# Patient Record
Sex: Female | Born: 1995 | Race: Black or African American | Hispanic: No | Marital: Single | Smoking: Former smoker
Health system: Southern US, Community
[De-identification: ages and names within clinical notes are randomized; demographics above are authoritative.]

---
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
## 2014-06-22 ENCOUNTER — Encounter (HOSPITAL_BASED_OUTPATIENT_CLINIC_OR_DEPARTMENT_OTHER): Payer: Self-pay

## 2014-06-22 ENCOUNTER — Emergency Department (HOSPITAL_BASED_OUTPATIENT_CLINIC_OR_DEPARTMENT_OTHER)
Admission: EM | Admit: 2014-06-22 | Discharge: 2014-06-22 | Disposition: A | Discharge status: Home / Self Care | Payer: Medicaid Other | Attending: Emergency Medicine | Admitting: Emergency Medicine

## 2014-06-22 DIAGNOSIS — O210 Mild hyperemesis gravidarum: Secondary | ICD-10-CM | POA: Diagnosis present

## 2014-06-22 DIAGNOSIS — O9989 Other specified diseases and conditions complicating pregnancy, childbirth and the puerperium: Secondary | ICD-10-CM | POA: Insufficient documentation

## 2014-06-22 DIAGNOSIS — Z3A00 Weeks of gestation of pregnancy not specified: Secondary | ICD-10-CM | POA: Diagnosis not present

## 2014-06-22 DIAGNOSIS — O99331 Smoking (tobacco) complicating pregnancy, first trimester: Secondary | ICD-10-CM | POA: Diagnosis not present

## 2014-06-22 DIAGNOSIS — R112 Nausea with vomiting, unspecified: Secondary | ICD-10-CM

## 2014-06-22 DIAGNOSIS — Z79899 Other long term (current) drug therapy: Secondary | ICD-10-CM | POA: Insufficient documentation

## 2014-06-22 DIAGNOSIS — R5383 Other fatigue: Secondary | ICD-10-CM | POA: Insufficient documentation

## 2014-06-22 DIAGNOSIS — Z3490 Encounter for supervision of normal pregnancy, unspecified, unspecified trimester: Secondary | ICD-10-CM

## 2014-06-22 DIAGNOSIS — O2341 Unspecified infection of urinary tract in pregnancy, first trimester: Secondary | ICD-10-CM | POA: Insufficient documentation

## 2014-06-22 DIAGNOSIS — F17219 Nicotine dependence, cigarettes, with unspecified nicotine-induced disorders: Secondary | ICD-10-CM | POA: Diagnosis not present

## 2014-06-22 LAB — URINALYSIS, ROUTINE W REFLEX MICROSCOPIC
Glucose, UA: NEGATIVE mg/dL
HGB URINE DIPSTICK: NEGATIVE
KETONES UR: 40 mg/dL — AB
Leukocytes, UA: NEGATIVE
Nitrite: NEGATIVE
PROTEIN: 100 mg/dL — AB
Specific Gravity, Urine: 1.023 (ref 1.005–1.030)
UROBILINOGEN UA: 0.2 mg/dL (ref 0.0–1.0)
pH: 5.5 (ref 5.0–8.0)

## 2014-06-22 LAB — URINE MICROSCOPIC-ADD ON

## 2014-06-22 LAB — PREGNANCY, URINE: Preg Test, Ur: POSITIVE — AB

## 2014-06-22 MED ORDER — SODIUM CHLORIDE 0.9 % IV BOLUS (SEPSIS)
1000.0000 mL | Freq: Once | INTRAVENOUS | Status: AC
  Administered 2014-06-22: 1000 mL via INTRAVENOUS

## 2014-06-22 MED ORDER — PROMETHAZINE HCL 25 MG PO TABS: 25.0000 mg | ORAL_TABLET | Freq: Four times a day (QID) | ORAL | Status: DC | PRN

## 2014-06-22 MED ORDER — NITROFURANTOIN MONOHYD MACRO 100 MG PO CAPS: 100.0000 mg | ORAL_CAPSULE | Freq: Two times a day (BID) | ORAL | Status: DC

## 2014-06-22 MED ORDER — ACETAMINOPHEN 500 MG PO TABS
1000.0000 mg | ORAL_TABLET | Freq: Once | ORAL | Status: AC
  Administered 2014-06-22: 1000 mg via ORAL
  Filled 2014-06-22: qty 2

## 2014-06-22 MED ORDER — PRENATAL COMPLETE 14-0.4 MG PO TABS: 14.0000 mg | ORAL_TABLET | Freq: Every day | ORAL | Status: DC

## 2014-06-22 MED ORDER — METOCLOPRAMIDE HCL 5 MG/ML IJ SOLN
10.0000 mg | Freq: Once | INTRAMUSCULAR | Status: AC
  Administered 2014-06-22: 10 mg via INTRAVENOUS
  Filled 2014-06-22: qty 2

## 2014-06-22 NOTE — Discharge Instructions (Signed)
Take prenatal vitamins daily. Take Phenergan as directed as needed for nausea and vomiting. This medication may cause drowsiness, do not drive or operate heavy machinery with Phenergan. Follow-up with the women's outpatient clinic to establish care with an OB/GYN. Take macrobid twice daily for 1 week.  First Trimester of Pregnancy The first trimester of pregnancy is from week 1 until the end of week 12 (months 1 through 3). A week after a sperm fertilizes an egg, the egg will implant on the wall of the uterus. This embryo will begin to develop into a baby. Genes from you and your partner are forming the baby. The female genes determine whether the baby is a boy or a girl. At 6-8 weeks, the eyes and face are formed, and the heartbeat can be seen on ultrasound. At the end of 12 weeks, all the baby's organs are formed.  Now that you are pregnant, you will want to do everything you can to have a healthy baby. Two of the most important things are to get good prenatal care and to follow your health care provider's instructions. Prenatal care is all the medical care you receive before the baby's birth. This care will help prevent, find, and treat any problems during the pregnancy and childbirth. BODY CHANGES Your body goes through many changes during pregnancy. The changes vary from woman to woman.   You may gain or lose a couple of pounds at first.  You may feel sick to your stomach (nauseous) and throw up (vomit). If the vomiting is uncontrollable, call your health care provider.  You may tire easily.  You may develop headaches that can be relieved by medicines approved by your health care provider.  You may urinate more often. Painful urination may mean you have a bladder infection.  You may develop heartburn as a result of your pregnancy.  You may develop constipation because certain hormones are causing the muscles that push waste through your intestines to slow down.  You may develop hemorrhoids  or swollen, bulging veins (varicose veins).  Your breasts may begin to grow larger and become tender. Your nipples may stick out more, and the tissue that surrounds them (areola) may become darker.  Your gums may bleed and may be sensitive to brushing and flossing.  Dark spots or blotches (chloasma, mask of pregnancy) may develop on your face. This will likely fade after the baby is born.  Your menstrual periods will stop.  You may have a loss of appetite.  You may develop cravings for certain kinds of food.  You may have changes in your emotions from day to day, such as being excited to be pregnant or being concerned that something may go wrong with the pregnancy and baby.  You may have more vivid and strange dreams.  You may have changes in your hair. These can include thickening of your hair, rapid growth, and changes in texture. Some women also have hair loss during or after pregnancy, or hair that feels dry or thin. Your hair will most likely return to normal after your baby is born. WHAT TO EXPECT AT YOUR PRENATAL VISITS During a routine prenatal visit:  You will be weighed to make sure you and the baby are growing normally.  Your blood pressure will be taken.  Your abdomen will be measured to track your baby's growth.  The fetal heartbeat will be listened to starting around week 10 or 12 of your pregnancy.  Test results from any previous visits will  be discussed. Your health care provider may ask you:  How you are feeling.  If you are feeling the baby move.  If you have had any abnormal symptoms, such as leaking fluid, bleeding, severe headaches, or abdominal cramping.  If you have any questions. Other tests that may be performed during your first trimester include:  Blood tests to find your blood type and to check for the presence of any previous infections. They will also be used to check for low iron levels (anemia) and Rh antibodies. Later in the pregnancy, blood  tests for diabetes will be done along with other tests if problems develop.  Urine tests to check for infections, diabetes, or protein in the urine.  An ultrasound to confirm the proper growth and development of the baby.  An amniocentesis to check for possible genetic problems.  Fetal screens for spina bifida and Down syndrome.  You may need other tests to make sure you and the baby are doing well. HOME CARE INSTRUCTIONS  Medicines  Follow your health care provider's instructions regarding medicine use. Specific medicines may be either safe or unsafe to take during pregnancy.  Take your prenatal vitamins as directed.  If you develop constipation, try taking a stool softener if your health care provider approves. Diet  Eat regular, well-balanced meals. Choose a variety of foods, such as meat or vegetable-based protein, fish, milk and low-fat dairy products, vegetables, fruits, and whole grain breads and cereals. Your health care provider will help you determine the amount of weight gain that is right for you.  Avoid raw meat and uncooked cheese. These carry germs that can cause birth defects in the baby.  Eating four or five small meals rather than three large meals a day may help relieve nausea and vomiting. If you start to feel nauseous, eating a few soda crackers can be helpful. Drinking liquids between meals instead of during meals also seems to help nausea and vomiting.  If you develop constipation, eat more high-fiber foods, such as fresh vegetables or fruit and whole grains. Drink enough fluids to keep your urine clear or pale yellow. Activity and Exercise  Exercise only as directed by your health care provider. Exercising will help you:  Control your weight.  Stay in shape.  Be prepared for labor and delivery.  Experiencing pain or cramping in the lower abdomen or low back is a good sign that you should stop exercising. Check with your health care provider before  continuing normal exercises.  Try to avoid standing for long periods of time. Move your legs often if you must stand in one place for a long time.  Avoid heavy lifting.  Wear low-heeled shoes, and practice good posture.  You may continue to have sex unless your health care provider directs you otherwise. Relief of Pain or Discomfort  Wear a good support bra for breast tenderness.   Take warm sitz baths to soothe any pain or discomfort caused by hemorrhoids. Use hemorrhoid cream if your health care provider approves.   Rest with your legs elevated if you have leg cramps or low back pain.  If you develop varicose veins in your legs, wear support hose. Elevate your feet for 15 minutes, 3-4 times a day. Limit salt in your diet. Prenatal Care  Schedule your prenatal visits by the twelfth week of pregnancy. They are usually scheduled monthly at first, then more often in the last 2 months before delivery.  Write down your questions. Take them to your  prenatal visits.  Keep all your prenatal visits as directed by your health care provider. Safety  Wear your seat belt at all times when driving.  Make a list of emergency phone numbers, including numbers for family, friends, the hospital, and police and fire departments. General Tips  Ask your health care provider for a referral to a local prenatal education class. Begin classes no later than at the beginning of month 6 of your pregnancy.  Ask for help if you have counseling or nutritional needs during pregnancy. Your health care provider can offer advice or refer you to specialists for help with various needs.  Do not use hot tubs, steam rooms, or saunas.  Do not douche or use tampons or scented sanitary pads.  Do not cross your legs for long periods of time.  Avoid cat litter boxes and soil used by cats. These carry germs that can cause birth defects in the baby and possibly loss of the fetus by miscarriage or stillbirth.  Avoid  all smoking, herbs, alcohol, and medicines not prescribed by your health care provider. Chemicals in these affect the formation and growth of the baby.  Schedule a dentist appointment. At home, brush your teeth with a soft toothbrush and be gentle when you floss. SEEK MEDICAL CARE IF:   You have dizziness.  You have mild pelvic cramps, pelvic pressure, or nagging pain in the abdominal area.  You have persistent nausea, vomiting, or diarrhea.  You have a bad smelling vaginal discharge.  You have pain with urination.  You notice increased swelling in your face, hands, legs, or ankles. SEEK IMMEDIATE MEDICAL CARE IF:   You have a fever.  You are leaking fluid from your vagina.  You have spotting or bleeding from your vagina.  You have severe abdominal cramping or pain.  You have rapid weight gain or loss.  You vomit blood or material that looks like coffee grounds.  You are exposed to [place] measles and have never had them.  You are exposed to fifth disease or chickenpox.  You develop a severe headache.  You have shortness of breath.  You have any kind of trauma, such as from a fall or a car accident. Document Released: 03/19/2001 Document Revised: 08/09/2013 Document Reviewed: 02/02/2013 [facility] Patient Information 2015 [place], [place]. This information is not intended to replace advice given to you by your health care provider. Make sure you discuss any questions you have with your health care provider. Hyperemesis Gravidarum Hyperemesis gravidarum is a severe form of nausea and vomiting that happens during pregnancy. Hyperemesis is worse than morning sickness. It may cause you to have nausea or vomiting all day for many days. It may keep you from eating and drinking enough food and liquids. Hyperemesis usually occurs during the first half (the first 20 weeks) of pregnancy. It often goes away once a woman is in her second half of pregnancy. However, sometimes hyperemesis  continues through an entire pregnancy.  CAUSES  The cause of this condition is not completely known but is thought to be related to changes in the body's hormones when pregnant. It could be from the high level of the pregnancy hormone or an increase in estrogen in the body.  SIGNS AND SYMPTOMS   Severe nausea and vomiting.  Nausea that does not go away.  Vomiting that does not allow you to keep any food down.  Weight loss and body fluid loss (dehydration).  Having no desire to eat or not liking food you  have previously enjoyed. DIAGNOSIS  Your health care provider will do a physical exam and ask you about your symptoms. He or she may also order blood tests and urine tests to make sure something else is not causing the problem.  TREATMENT  You may only need medicine to control the problem. If medicines do not control the nausea and vomiting, you will be treated in the hospital to prevent dehydration, increased acid in the blood (acidosis), weight loss, and changes in the electrolytes in your body that may harm the unborn baby (fetus). You may need IV fluids.  HOME CARE INSTRUCTIONS   Only take over-the-counter or prescription medicines as directed by your health care provider.  Try eating a couple of dry crackers or toast in the morning before getting out of bed.  Avoid foods and smells that upset your stomach.  Avoid fatty and spicy foods.  Eat 5-6 small meals a day.  Do not drink when eating meals. Drink between meals.  For snacks, eat high-protein foods, such as cheese.  Eat or suck on things that have ginger in them. Ginger helps nausea.  Avoid food preparation. The smell of food can spoil your appetite.  Avoid iron pills and iron in your multivitamins until after 3-4 months of being pregnant. However, consult with your health care provider before stopping any prescribed iron pills. SEEK MEDICAL CARE IF:   Your abdominal pain increases.  You have a severe  headache.  You have vision problems.  You are losing weight. SEEK IMMEDIATE MEDICAL CARE IF:   You are unable to keep fluids down.  You vomit blood.  You have constant nausea and vomiting.  You have excessive weakness.  You have extreme thirst.  You have dizziness or fainting.  You have a fever or persistent symptoms for more than 2-3 days.  You have a fever and your symptoms suddenly get worse. MAKE SURE YOU:   Understand these instructions.  Will watch your condition.  Will get help right away if you are not doing well or get worse. Document Released: 03/25/2005 Document Revised: 01/13/2013 Document Reviewed: 11/04/2012 [facility] Patient Information 2015 [place], [place]. This information is not intended to replace advice given to you by your health care provider. Make sure you discuss any questions you have with your health care provider.  Urinary Tract Infection Urinary tract infections (UTIs) can develop anywhere along your urinary tract. Your urinary tract is your body's drainage system for removing wastes and extra water. Your urinary tract includes two kidneys, two ureters, a bladder, and a urethra. Your kidneys are a pair of bean-shaped organs. Each kidney is about the size of your fist. They are located below your ribs, one on each side of your spine. CAUSES Infections are caused by microbes, which are microscopic organisms, including fungi, viruses, and bacteria. These organisms are so small that they can only be seen through a microscope. Bacteria are the microbes that most commonly cause UTIs. SYMPTOMS  Symptoms of UTIs may vary by age and gender of the patient and by the location of the infection. Symptoms in young women typically include a frequent and intense urge to urinate and a painful, burning feeling in the bladder or urethra during urination. Older women and men are more likely to be tired, shaky, and weak and have muscle aches and abdominal pain. A fever may  mean the infection is in your kidneys. Other symptoms of a kidney infection include pain in your back or sides below the ribs,  nausea, and vomiting. DIAGNOSIS To diagnose a UTI, your caregiver will ask you about your symptoms. Your caregiver also will ask to provide a urine sample. The urine sample will be tested for bacteria and white blood cells. White blood cells are made by your body to help fight infection. TREATMENT  Typically, UTIs can be treated with medication. Because most UTIs are caused by a bacterial infection, they usually can be treated with the use of antibiotics. The choice of antibiotic and length of treatment depend on your symptoms and the type of bacteria causing your infection. HOME CARE INSTRUCTIONS  If you were prescribed antibiotics, take them exactly as your caregiver instructs you. Finish the medication even if you feel better after you have only taken some of the medication.  Drink enough water and fluids to keep your urine clear or pale yellow.  Avoid caffeine, tea, and carbonated beverages. They tend to irritate your bladder.  Empty your bladder often. Avoid holding urine for long periods of time.  Empty your bladder before and after sexual intercourse.  After a bowel movement, women should cleanse from front to back. Use each tissue only once. SEEK MEDICAL CARE IF:   You have back pain.  You develop a fever.  Your symptoms do not begin to resolve within 3 days. SEEK IMMEDIATE MEDICAL CARE IF:   You have severe back pain or lower abdominal pain.  You develop chills.  You have nausea or vomiting.  You have continued burning or discomfort with urination. MAKE SURE YOU:   Understand these instructions.  Will watch your condition.  Will get help right away if you are not doing well or get worse. Document Released: 01/02/2005 Document Revised: 09/24/2011 Document Reviewed: 05/03/2011 [facility] Patient Information 2015 [place], [place]. This information  is not intended to replace advice given to you by your health care provider. Make sure you discuss any questions you have with your health care provider.

## 2014-06-22 NOTE — ED Provider Notes (Signed)
CSN: [id number]     Arrival date & time 06/22/14  1747 History   First MD Initiated Contact with Patient 06/22/14 1822     Chief Complaint  Patient presents with  . Emesis     (Consider location/radiation/quality/duration/timing/severity/associated sxs/prior Treatment) HPI Comments: 19 year old female complaining of nausea and vomiting 2 days. Reports yesterday evening she started to feel nauseous, and since then has had 20-25 episodes of nonbloody, nonbilious emesis. Yesterday her appetite was decreased. Unable to keep anything down today. Denies abdominal pain, fever, chills, urinary symptoms, vaginal bleeding or discharge. Last menstrual period was in the middle of last month, she is due for her cycle at this time. Not on birth control. She is starting to feel tired.  Patient is a 19 y.o. female presenting with vomiting. The history is provided by the patient.  Emesis Severity:  Severe Duration:  2 days Timing:  Constant Number of daily episodes:  20 Progression:  Unchanged Chronicity:  New Recent urination:  Normal Relieved by:  Nothing Worsened by:  Nothing tried Ineffective treatments:  None tried   History reviewed. No pertinent past medical history. History reviewed. No pertinent past surgical history. History reviewed. No pertinent family history. History  Substance Use Topics  . Smoking status: Current Some Day Smoker    Types: Cigarettes  . Smokeless tobacco: Not on file  . Alcohol Use: Yes     Comment: occ   OB History    No data available     Review of Systems  Constitutional: Positive for fatigue.  Gastrointestinal: Positive for nausea and vomiting.  All other systems reviewed and are negative.     Allergies  Review of patient's allergies indicates no known allergies.  Home Medications   Prior to Admission medications   Medication Sig Start Date End Date Taking? Authorizing Provider  amoxicillin-clavulanate (AUGMENTIN) 875-125 MG per tablet Take  1 tablet by mouth every 12 (twelve) hours. 11/25/13   Teressa LowerVrinda Pickering, NP  HYDROcodone-acetaminophen (NORCO/VICODIN) 5-325 MG per tablet Take 1 tablet by mouth every 4 (four) hours as needed. 04/26/13   Garlon HatchetLisa M Sanders, PA-C  HYDROcodone-acetaminophen (NORCO/VICODIN) 5-325 MG per tablet Take 1-2 tablets by mouth every 4 (four) hours as needed. 11/25/13   Teressa LowerVrinda Pickering, NP  Prenatal Vit-Fe Fumarate-FA (PRENATAL COMPLETE) 14-0.4 MG TABS Take 14 mg by mouth daily. 06/22/14   Kathrynn Speedobyn M Mikesha Migliaccio, PA-C  promethazine (PHENERGAN) 25 MG tablet Take 1 tablet (25 mg total) by mouth every 6 (six) hours as needed for nausea or vomiting. 06/22/14   Arvid Marengo M Mohamadou Maciver, PA-C   BP 122/76 mmHg  Pulse 84  Temp(Src) 98.6 F (37 C) (Oral)  Resp 16  Ht 5\' 6"  (1.676 m)  Wt 128 lb (58.06 kg)  BMI 20.67 kg/m2  SpO2 97%  LMP 05/23/2014 Physical Exam  Constitutional: She is oriented to person, place, and time. She appears well-developed and well-nourished. No distress.  HENT:  Head: Normocephalic and atraumatic.  Mouth/Throat: Oropharynx is clear and moist.  Eyes: Conjunctivae are normal.  Neck: Normal range of motion. Neck supple.  Cardiovascular: Normal rate, regular rhythm and normal heart sounds.   Pulmonary/Chest: Effort normal and breath sounds normal.  Abdominal: Soft. Bowel sounds are normal. She exhibits no distension and no mass. There is no tenderness. There is no rebound and no guarding.  Musculoskeletal: Normal range of motion. She exhibits no edema.  Neurological: She is alert and oriented to person, place, and time.  Skin: Skin is warm and dry. She is  not diaphoretic.  Psychiatric: She has a normal mood and affect. Her behavior is normal.  Nursing note and vitals reviewed.   ED Course  Procedures (including critical care time) Labs Review Labs Reviewed  PREGNANCY, URINE - Abnormal; Notable for the following:    Preg Test, Ur POSITIVE (*)    All other components within normal limits  URINALYSIS, ROUTINE  W REFLEX MICROSCOPIC - Abnormal; Notable for the following:    Color, Urine AMBER (*)    APPearance CLOUDY (*)    Bilirubin Urine SMALL (*)    Ketones, ur 40 (*)    Protein, ur 100 (*)    All other components within normal limits  URINE MICROSCOPIC-ADD ON - Abnormal; Notable for the following:    Squamous Epithelial / LPF MANY (*)    Bacteria, UA FEW (*)    Casts HYALINE CASTS (*)    All other components within normal limits  URINE CULTURE    Imaging Review No results found.   EKG Interpretation None      MDM   Final diagnoses:  Non-intractable vomiting with nausea, vomiting of unspecified type  Pregnancy  Hyperemesis gravidarum   NAD. AFVSS. Abdomen soft and nontender. Moist mucous membranes. Urine pregnancy positive. Urinalysis positive for infection. After receiving IV fluids, Phenergan and Tylenol, patient reports she is feeling much better. Tolerating PO. Nausea and vomiting most likely from early pregnancy. She has never been pregnant in the past. No vaginal bleeding. Stable for discharge, will discharge home with Macrobid, prenatal vitamins and Phenergan. I advised her to follow-up with women's outpatient clinic as soon as possible. Stable for discharge. Return precautions given. Patient states understanding of treatment care plan and is agreeable.  Kathrynn Speed, PA-C 06/22/14 2040  Elwin Mocha, MD 06/22/14 [phone]

## 2014-06-22 NOTE — ED Notes (Addendum)
Pt reports 2 day history of N/V/D - reports slight headache, no appetite.

## 2014-06-23 LAB — URINE CULTURE
Colony Count: NO GROWTH
Culture: NO GROWTH

## 2014-09-19 ENCOUNTER — Emergency Department (HOSPITAL_BASED_OUTPATIENT_CLINIC_OR_DEPARTMENT_OTHER)
Admission: EM | Admit: 2014-09-19 | Discharge: 2014-09-19 | Disposition: A | Discharge status: Home / Self Care | Payer: Medicaid Other | Attending: Emergency Medicine | Admitting: Emergency Medicine

## 2014-09-19 ENCOUNTER — Emergency Department (HOSPITAL_BASED_OUTPATIENT_CLINIC_OR_DEPARTMENT_OTHER): Payer: Medicaid Other

## 2014-09-19 ENCOUNTER — Encounter (HOSPITAL_BASED_OUTPATIENT_CLINIC_OR_DEPARTMENT_OTHER): Payer: Self-pay | Admitting: Emergency Medicine

## 2014-09-19 DIAGNOSIS — F1310 Sedative, hypnotic or anxiolytic abuse, uncomplicated: Secondary | ICD-10-CM | POA: Insufficient documentation

## 2014-09-19 DIAGNOSIS — F1510 Other stimulant abuse, uncomplicated: Secondary | ICD-10-CM | POA: Diagnosis not present

## 2014-09-19 DIAGNOSIS — Z3202 Encounter for pregnancy test, result negative: Secondary | ICD-10-CM | POA: Diagnosis not present

## 2014-09-19 DIAGNOSIS — F410 Panic disorder [episodic paroxysmal anxiety] without agoraphobia: Secondary | ICD-10-CM | POA: Diagnosis not present

## 2014-09-19 DIAGNOSIS — F1290 Cannabis use, unspecified, uncomplicated: Secondary | ICD-10-CM

## 2014-09-19 DIAGNOSIS — Z720 Tobacco use: Secondary | ICD-10-CM | POA: Diagnosis not present

## 2014-09-19 DIAGNOSIS — F1210 Cannabis abuse, uncomplicated: Secondary | ICD-10-CM | POA: Insufficient documentation

## 2014-09-19 DIAGNOSIS — Z79899 Other long term (current) drug therapy: Secondary | ICD-10-CM | POA: Diagnosis not present

## 2014-09-19 DIAGNOSIS — R079 Chest pain, unspecified: Secondary | ICD-10-CM | POA: Diagnosis present

## 2014-09-19 LAB — CBC
HCT: 40.8 % (ref 36.0–46.0)
Hemoglobin: 13.2 g/dL (ref 12.0–15.0)
MCH: 29.9 pg (ref 26.0–34.0)
MCHC: 32.4 g/dL (ref 30.0–36.0)
MCV: 92.3 fL (ref 78.0–100.0)
Platelets: 256 10*3/uL (ref 150–400)
RBC: 4.42 MIL/uL (ref 3.87–5.11)
RDW: 14.8 % (ref 11.5–15.5)
WBC: 14.2 10*3/uL — ABNORMAL HIGH (ref 4.0–10.5)

## 2014-09-19 LAB — BASIC METABOLIC PANEL
Anion gap: 10 (ref 5–15)
BUN: 11 mg/dL (ref 6–20)
CO2: 24 mmol/L (ref 22–32)
Calcium: 8.8 mg/dL — ABNORMAL LOW (ref 8.9–10.3)
Chloride: 104 mmol/L (ref 101–111)
Creatinine, Ser: 0.72 mg/dL (ref 0.44–1.00)
GFR calc Af Amer: >60 mL/min (ref >60)
GFR calc non Af Amer: >60 mL/min (ref >60)
GLUCOSE: 151 mg/dL — AB (ref 65–99)
Potassium: 3.5 mmol/L (ref 3.5–5.1)
Sodium: 138 mmol/L (ref 135–145)

## 2014-09-19 LAB — RAPID URINE DRUG SCREEN, HOSP PERFORMED
AMPHETAMINES: POSITIVE — AB
BENZODIAZEPINES: POSITIVE — AB
Barbiturates: NOT DETECTED
COCAINE: NOT DETECTED
OPIATES: NOT DETECTED
Tetrahydrocannabinol: POSITIVE — AB

## 2014-09-19 LAB — PREGNANCY, URINE: Preg Test, Ur: NEGATIVE

## 2014-09-19 MED ORDER — ONDANSETRON HCL 4 MG/2ML IJ SOLN
4.0000 mg | Freq: Once | INTRAMUSCULAR | Status: AC
  Administered 2014-09-19: 4 mg via INTRAVENOUS
  Filled 2014-09-19: qty 2

## 2014-09-19 MED ORDER — ONDANSETRON 4 MG PO TBDP
4.0000 mg | ORAL_TABLET | Freq: Once | ORAL | Status: AC
  Administered 2014-09-19: 4 mg via ORAL
  Filled 2014-09-19: qty 1

## 2014-09-19 MED ORDER — LORAZEPAM 2 MG/ML IJ SOLN
1.0000 mg | Freq: Once | INTRAMUSCULAR | Status: AC
  Administered 2014-09-19: 1 mg via INTRAMUSCULAR
  Filled 2014-09-19: qty 1

## 2014-09-19 NOTE — ED Notes (Signed)
Patient transported to X-ray and returned 

## 2014-09-19 NOTE — ED Provider Notes (Signed)
CSN: [id number]     Arrival date & time 09/19/14  1744 History   First MD Initiated Contact with Patient 09/19/14 1811     Chief Complaint  Patient presents with  . Emesis  . Chest Pain     (Consider location/radiation/quality/duration/timing/severity/associated sxs/prior Treatment) HPI Comments: 19 year old female complaining of gradual onset chest pain beginning yesterday evening when she tried a "small amount of Molly". States she has never tried this before. Pain is located in the center of her chest, described as sharp and tight. No aggravating or alleviating factors. Denies shortness of breath. This morning, she felt nauseous and vomited 3 times, nonbloody emesis. States she has a sensation that her chest is "slowing down". Denies fever, chills, abdominal pain, headache, dizziness, lightheadedness, numbness, tingling or weakness. Denies any other drug use. No alcohol use. Nonsmoker. No family history of heart disease. Patient is a poor historian and getting agitated when asking questions.  Patient is a 19 y.o. female presenting with vomiting and chest pain. The history is provided by the patient and a parent.  Emesis Chest Pain Associated symptoms: nausea and vomiting     History reviewed. No pertinent past medical history. History reviewed. No pertinent past surgical history. History reviewed. No pertinent family history. History  Substance Use Topics  . Smoking status: Current Some Day Smoker    Types: Cigarettes  . Smokeless tobacco: Not on file  . Alcohol Use: Yes     Comment: occ   OB History    No data available     Review of Systems  Cardiovascular: Positive for chest pain.  Gastrointestinal: Positive for nausea and vomiting.  All other systems reviewed and are negative.     Allergies  Review of patient's allergies indicates no known allergies.  Home Medications   Prior to Admission medications   Medication Sig Start Date End Date Taking? Authorizing  Provider  amoxicillin-clavulanate (AUGMENTIN) 875-125 MG per tablet Take 1 tablet by mouth every 12 (twelve) hours. 11/25/13   Teressa Lower, NP  HYDROcodone-acetaminophen (NORCO/VICODIN) 5-325 MG per tablet Take 1 tablet by mouth every 4 (four) hours as needed. 04/26/13   Garlon Hatchet, PA-C  HYDROcodone-acetaminophen (NORCO/VICODIN) 5-325 MG per tablet Take 1-2 tablets by mouth every 4 (four) hours as needed. 11/25/13   Teressa Lower, NP  nitrofurantoin, macrocrystal-monohydrate, (MACROBID) 100 MG capsule Take 1 capsule (100 mg total) by mouth 2 (two) times daily. X 7 days 06/22/14   Kathrynn Speed, PA-C  Prenatal Vit-Fe Fumarate-FA (PRENATAL COMPLETE) 14-0.4 MG TABS Take 14 mg by mouth daily. 06/22/14   Kathrynn Speed, PA-C  promethazine (PHENERGAN) 25 MG tablet Take 1 tablet (25 mg total) by mouth every 6 (six) hours as needed for nausea or vomiting. 06/22/14   Rafaela Dinius M Anett Ranker, PA-C   BP 116/98 mmHg  Pulse 90  Temp(Src) 97.8 F (36.6 C) (Oral)  Resp 20  Ht 5\' 6"  (1.676 m)  Wt 122 lb (55.339 kg)  BMI 19.70 kg/m2  SpO2 94% Physical Exam  Constitutional: She is oriented to person, place, and time. She appears well-developed and well-nourished. No distress.  HENT:  Head: Normocephalic and atraumatic.  Mouth/Throat: Oropharynx is clear and moist.  Eyes: Conjunctivae and EOM are normal. Pupils are equal, round, and reactive to light.  Neck: Normal range of motion. Neck supple. No JVD present.  Cardiovascular: Normal rate, regular rhythm, normal heart sounds and intact distal pulses.   No extremity edema.  Pulmonary/Chest: Effort normal and breath sounds normal.  No respiratory distress.  Abdominal: Soft. Bowel sounds are normal. There is no tenderness.  Musculoskeletal: Normal range of motion. She exhibits no edema.  Neurological: She is alert and oriented to person, place, and time. She has normal strength. No sensory deficit.  Speech fluent, goal oriented. Moves limbs without ataxia. Equal  grip strength bilateral.  Skin: Skin is warm and dry. She is not diaphoretic.  Psychiatric: Her mood appears anxious. She is agitated.  Nursing note and vitals reviewed.   ED Course  Procedures (including critical care time) Labs Review Labs Reviewed  URINE RAPID DRUG SCREEN, HOSP PERFORMED - Abnormal; Notable for the following:    Benzodiazepines POSITIVE (*)    Amphetamines POSITIVE (*)    Tetrahydrocannabinol POSITIVE (*)    All other components within normal limits  CBC - Abnormal; Notable for the following:    WBC 14.2 (*)    All other components within normal limits  BASIC METABOLIC PANEL - Abnormal; Notable for the following:    Glucose, Bld 151 (*)    Calcium 8.8 (*)    All other components within normal limits  PREGNANCY, URINE    Imaging Review Dg Chest 2 View  09/19/2014   CLINICAL DATA:  Chest pain, difficulty breathing, vomiting  EXAM: CHEST  2 VIEW  COMPARISON:  None.  FINDINGS: Lungs are clear.  No pleural effusion or pneumothorax.  Nipple shadows overlying the bilateral lower lungs.  Heart is normal in size.  Visualized osseous structures are within normal limits.  IMPRESSION: Normal chest radiographs.   Electronically Signed   By: Charline Bills M.D.   On: 09/19/2014 19:06     EKG Interpretation   Date/Time:  Monday September 19 2014 18:22:42 EDT Ventricular Rate:  113 PR Interval:  154 QRS Duration: 78 QT Interval:  390 QTC Calculation: 534 R Axis:   76 Text Interpretation:  Sinus tachycardia with Premature supraventricular  complexes Nonspecific ST abnormality Prolonged QT Abnormal ECG Confirmed  by DELO  MD, DOUGLAS ([id number]) on 09/19/2014 7:10:31 PM      MDM   Final diagnoses:  Panic attack  Amphetamine abuse  Marijuana use   Nontoxic appearing, NAD. AF VSS. Symptoms beginning after taking a "Molly". Appears to be having a panic attack. Urine drug screen positive for benzos, amphetamines and THC. Has a leukocytosis of 14.2, I do not feel this is  from any infectious process. Doubt cardiac or PE. Low risk. Symptoms improved after Ativan. Advised against drug use. Stable for discharge. Return precautions given. Patient states understanding of treatment care plan and is agreeable.  Kathrynn Speed, PA-C 09/19/14 2029  Kathrynn Speed, PA-C 09/19/14 2030  Geoffery Lyons, MD 09/20/14 [phone]

## 2014-09-19 NOTE — ED Notes (Signed)
D/c home with ride- no new rx given 

## 2014-09-19 NOTE — ED Notes (Signed)
Patient transported to X-ray 

## 2014-09-19 NOTE — ED Notes (Signed)
Patient tried some "molly" last night and today she has had abdominal pain and vomiting. The patient reports that her "chest is slowing down"

## 2014-09-19 NOTE — Discharge Instructions (Signed)
Panic Attacks °Panic attacks are sudden, short-lived surges of severe anxiety, fear, or discomfort. They may occur for no reason when you are relaxed, when you are anxious, or when you are sleeping. Panic attacks may occur for a number of reasons:  °· Healthy people occasionally have panic attacks in extreme, life-threatening situations, such as war or natural disasters. Normal anxiety is a protective mechanism of the body that helps us react to danger (fight or flight response). °· Panic attacks are often seen with anxiety disorders, such as panic disorder, social anxiety disorder, generalized anxiety disorder, and phobias. Anxiety disorders cause excessive or uncontrollable anxiety. They may interfere with your relationships or other life activities. °· Panic attacks are sometimes seen with other mental illnesses, such as depression and posttraumatic stress disorder. °· Certain medical conditions, prescription medicines, and drugs of abuse can cause panic attacks. °SYMPTOMS  °Panic attacks start suddenly, peak within 20 minutes, and are accompanied by four or more of the following symptoms: °· Pounding heart or fast heart rate (palpitations). °· Sweating. °· Trembling or shaking. °· Shortness of breath or feeling smothered. °· Feeling choked. °· Chest pain or discomfort. °· Nausea or strange feeling in your stomach. °· Dizziness, light-headedness, or feeling like you will faint. °· Chills or hot flushes. °· Numbness or tingling in your lips or hands and feet. °· Feeling that things are not real or feeling that you are not yourself. °· Fear of losing control or going crazy. °· Fear of dying. °Some of these symptoms can mimic serious medical conditions. For example, you may think you are having a heart attack. Although panic attacks can be very scary, they are not life threatening. °DIAGNOSIS  °Panic attacks are diagnosed through an assessment by your health care provider. Your health care provider will ask  questions about your symptoms, such as where and when they occurred. Your health care provider will also ask about your medical history and use of alcohol and drugs, including prescription medicines. Your health care provider may order blood tests or other studies to rule out a serious medical condition. Your health care provider may refer you to a mental health professional for further evaluation. °TREATMENT  °· Most healthy people who have one or two panic attacks in an extreme, life-threatening situation will not require treatment. °· The treatment for panic attacks associated with anxiety disorders or other mental illness typically involves counseling with a mental health professional, medicine, or a combination of both. Your health care provider will help determine what treatment is best for you. °· Panic attacks due to physical illness usually go away with treatment of the illness. If prescription medicine is causing panic attacks, talk with your health care provider about stopping the medicine, decreasing the dose, or substituting another medicine. °· Panic attacks due to alcohol or drug abuse go away with abstinence. Some adults need professional help in order to stop drinking or using drugs. °HOME CARE INSTRUCTIONS  °· Take all medicines as directed by your health care provider.   °· Schedule and attend follow-up visits as directed by your health care provider. It is important to keep all your appointments. °SEEK MEDICAL CARE IF: °· You are not able to take your medicines as prescribed. °· Your symptoms do not improve or get worse. °SEEK IMMEDIATE MEDICAL CARE IF:  °· You experience panic attack symptoms that are different than your usual symptoms. °· You have serious thoughts about hurting yourself or others. °· You are taking medicine for panic attacks and   have a serious side effect. MAKE SURE YOU:  Understand these instructions.  Will watch your condition.  Will get help right away if you are not  doing well or get worse. Document Released: 03/25/2005 Document Revised: 03/30/2013 Document Reviewed: 11/06/2012 [facility] Patient Information 2015 [place], [place]. This information is not intended to replace advice given to you by your health care provider. Make sure you discuss any questions you have with your health care provider.   Chemical Dependency Chemical dependency is an addiction to drugs or alcohol. It is characterized by the repeated behavior of seeking out and using drugs and alcohol despite harmful consequences to the health and safety of ones self and others.  RISK FACTORS There are certain situations or behaviors that increase a person's risk for chemical dependency. These include:  A family history of chemical dependency.  A history of mental health issues, including depression and anxiety.  A home environment where drugs and alcohol are easily available to you.  Drug or alcohol use at a young age. SYMPTOMS  The following symptoms can indicate chemical dependency:  Inability to limit the use of drugs or alcohol.  Nausea, sweating, shakiness, and anxiety that occurs when alcohol or drugs are not being used.  An increase in amount of drugs or alcohol that is necessary to get drunk or high. People who experience these symptoms can assess their use of drugs and alcohol by asking themselves the following questions:  Have you been told by friends or family that they are worried about your use of alcohol or drugs?  Do friends and family ever tell you about things you did while drinking alcohol or using drugs that you do not remember?  Do you lie about using alcohol or drugs or about the amounts you use?  Do you have difficulty completing daily tasks unless you use alcohol or drugs?  Is the level of your work or school performance lower because of your drug or alcohol use?  Do you get sick from using drugs or alcohol but keep using anyway?  Do you feel uncomfortable  in social situations unless you use alcohol or drugs?  Do you use drugs or alcohol to help forget problems? An answer of yes to any of these questions may indicate chemical dependency. Professional evaluation is suggested. Document Released: 03/19/2001 Document Revised: 06/17/2011 Document Reviewed: 05/31/2010 [facility] Patient Information 2015 [place], [place]. This information is not intended to replace advice given to you by your health care provider. Make sure you discuss any questions you have with your health care provider.

## 2014-10-15 ENCOUNTER — Encounter (HOSPITAL_BASED_OUTPATIENT_CLINIC_OR_DEPARTMENT_OTHER): Payer: Self-pay | Admitting: Emergency Medicine

## 2014-10-15 ENCOUNTER — Emergency Department (HOSPITAL_BASED_OUTPATIENT_CLINIC_OR_DEPARTMENT_OTHER): Payer: Medicaid Other

## 2014-10-15 ENCOUNTER — Emergency Department (HOSPITAL_BASED_OUTPATIENT_CLINIC_OR_DEPARTMENT_OTHER)
Admission: EM | Admit: 2014-10-15 | Discharge: 2014-10-15 | Disposition: A | Discharge status: Home / Self Care | Payer: Medicaid Other | Attending: Emergency Medicine | Admitting: Emergency Medicine

## 2014-10-15 DIAGNOSIS — R109 Unspecified abdominal pain: Secondary | ICD-10-CM | POA: Insufficient documentation

## 2014-10-15 DIAGNOSIS — Z720 Tobacco use: Secondary | ICD-10-CM | POA: Diagnosis not present

## 2014-10-15 DIAGNOSIS — Z79899 Other long term (current) drug therapy: Secondary | ICD-10-CM | POA: Insufficient documentation

## 2014-10-15 DIAGNOSIS — R197 Diarrhea, unspecified: Secondary | ICD-10-CM | POA: Insufficient documentation

## 2014-10-15 DIAGNOSIS — Z3202 Encounter for pregnancy test, result negative: Secondary | ICD-10-CM | POA: Diagnosis not present

## 2014-10-15 DIAGNOSIS — R112 Nausea with vomiting, unspecified: Secondary | ICD-10-CM | POA: Diagnosis present

## 2014-10-15 LAB — COMPREHENSIVE METABOLIC PANEL
ALT: 17 U/L (ref 14–54)
AST: 24 U/L (ref 15–41)
Albumin: 4.4 g/dL (ref 3.5–5.0)
Alkaline Phosphatase: 41 U/L (ref 38–126)
Anion gap: 11 (ref 5–15)
BUN: 12 mg/dL (ref 6–20)
CO2: 21 mmol/L — ABNORMAL LOW (ref 22–32)
Calcium: 9.2 mg/dL (ref 8.9–10.3)
Chloride: 105 mmol/L (ref 101–111)
Creatinine, Ser: 0.6 mg/dL (ref 0.44–1.00)
GFR calc Af Amer: >60 mL/min (ref >60)
GFR calc non Af Amer: >60 mL/min (ref >60)
Glucose, Bld: 108 mg/dL — ABNORMAL HIGH (ref 65–99)
Potassium: 3.7 mmol/L (ref 3.5–5.1)
Sodium: 137 mmol/L (ref 135–145)
Total Bilirubin: 0.7 mg/dL (ref 0.3–1.2)
Total Protein: 7.8 g/dL (ref 6.5–8.1)

## 2014-10-15 LAB — CBC WITH DIFFERENTIAL/PLATELET
Basophils Absolute: 0 10*3/uL (ref 0.0–0.1)
Basophils Relative: 0 % (ref 0–1)
Eosinophils Absolute: 0 10*3/uL (ref 0.0–0.7)
Eosinophils Relative: 0 % (ref 0–5)
HCT: 36.9 % (ref 36.0–46.0)
Hemoglobin: 12 g/dL (ref 12.0–15.0)
Lymphocytes Relative: 6 % — ABNORMAL LOW (ref 12–46)
Lymphs Abs: 0.4 10*3/uL — ABNORMAL LOW (ref 0.7–4.0)
MCH: 29.7 pg (ref 26.0–34.0)
MCHC: 32.5 g/dL (ref 30.0–36.0)
MCV: 91.3 fL (ref 78.0–100.0)
Monocytes Absolute: 0.2 10*3/uL (ref 0.1–1.0)
Monocytes Relative: 2 % — ABNORMAL LOW (ref 3–12)
Neutro Abs: 6.6 10*3/uL (ref 1.7–7.7)
Neutrophils Relative %: 92 % — ABNORMAL HIGH (ref 43–77)
Platelets: 211 10*3/uL (ref 150–400)
RBC: 4.04 MIL/uL (ref 3.87–5.11)
RDW: 13.9 % (ref 11.5–15.5)
WBC: 7.2 10*3/uL (ref 4.0–10.5)

## 2014-10-15 LAB — URINALYSIS, ROUTINE W REFLEX MICROSCOPIC
Bilirubin Urine: NEGATIVE
GLUCOSE, UA: NEGATIVE mg/dL
Hgb urine dipstick: NEGATIVE
Ketones, ur: >80 mg/dL — AB
Leukocytes, UA: NEGATIVE
Nitrite: NEGATIVE
PH: 8.5 — AB (ref 5.0–8.0)
PROTEIN: 30 mg/dL — AB
Specific Gravity, Urine: 1.027 (ref 1.005–1.030)
Urobilinogen, UA: 1 mg/dL (ref 0.0–1.0)

## 2014-10-15 LAB — URINE MICROSCOPIC-ADD ON

## 2014-10-15 LAB — LIPASE, BLOOD: Lipase: 16 U/L — ABNORMAL LOW (ref 22–51)

## 2014-10-15 LAB — PREGNANCY, URINE: PREG TEST UR: NEGATIVE

## 2014-10-15 MED ORDER — ONDANSETRON HCL 4 MG/2ML IJ SOLN: 4.0000 mg | Freq: Once | INTRAMUSCULAR | Status: DC

## 2014-10-15 MED ORDER — PROMETHAZINE HCL 25 MG/ML IJ SOLN
12.5000 mg | Freq: Once | INTRAMUSCULAR | Status: AC
  Administered 2014-10-15: 12.5 mg via INTRAVENOUS
  Filled 2014-10-15: qty 1

## 2014-10-15 MED ORDER — SODIUM CHLORIDE 0.9 % IV BOLUS (SEPSIS)
1000.0000 mL | Freq: Once | INTRAVENOUS | Status: AC
  Administered 2014-10-15: 1000 mL via INTRAVENOUS

## 2014-10-15 MED ORDER — PROMETHAZINE HCL 25 MG PO TABS: 25.0000 mg | ORAL_TABLET | Freq: Four times a day (QID) | ORAL | Status: DC | PRN

## 2014-10-15 MED ORDER — ONDANSETRON 8 MG PO TBDP
8.0000 mg | ORAL_TABLET | Freq: Once | ORAL | Status: AC
  Administered 2014-10-15: 8 mg via ORAL
  Filled 2014-10-15: qty 1

## 2014-10-15 NOTE — ED Notes (Signed)
Pt approached Nurse First to inquire about the wait time. Pt was updated appropriately and instructed to notify staff about changes in her condition or needs. Pt has been noted to be resting quietly, free of any retching or emesis since administration of Zofran.

## 2014-10-15 NOTE — ED Provider Notes (Signed)
CSN: [id number]   Arrival date & time 10/15/14 1431  History  This chart was scribed for  Geoffery Lyonsouglas Ranjit Ashurst, MD by Bethel BornBritney McCollum, ED Scribe. This patient was seen in room MH09/MH09 and the patient's care was started at 4:30 PM.  Chief Complaint  Patient presents with  . Emesis    HPI Patient is a 19 y.o. female presenting with vomiting. The history is provided by the patient. No language interpreter was used.  Emesis Severity:  Mild Timing:  Intermittent Quality:  Stomach contents Progression:  Worsening Chronicity:  Recurrent Recent urination:  Normal Context: not post-tussive and not self-induced   Relieved by:  Nothing Worsened by:  Nothing tried Associated symptoms: abdominal pain and diarrhea   Associated symptoms: no chills, no cough, no fever, no headaches, no myalgias, no sore throat and no URI    Berneda RoseKimberly Townley is a 19 y.o. female who presents to the Emergency Department complaining of nausea and  vomiting with sudden onset around 8 AM today after eating grits. Associated symptoms include abdominal pain just before vomiting and 2 episodes of diarrhea. Pt notes that she has been seen in the ED several times for similar symptoms and has 2-3 of these episodes per month. She denies fever and dysuria. No known sick contact. Menstruating normally, LNMP was "last month". She smokes marijuana daily and notes having no appetite without the drug.   History reviewed. No pertinent past medical history.  History reviewed. No pertinent past surgical history.  History reviewed. No pertinent family history.  History  Substance Use Topics  . Smoking status: Current Some Day Smoker    Types: Cigarettes  . Smokeless tobacco: Not on file  . Alcohol Use: Yes     Comment: occ     Review of Systems  Constitutional: Negative for fever and chills.  HENT: Negative for sore throat.   Respiratory: Negative for cough.   Cardiovascular: Negative for chest pain.  Gastrointestinal: Positive for  vomiting, abdominal pain and diarrhea.  Musculoskeletal: Negative for myalgias.  Neurological: Negative for headaches.  All other systems reviewed and are negative.   Home Medications   Prior to Admission medications   Medication Sig Start Date End Date Taking? Authorizing Provider  amoxicillin-clavulanate (AUGMENTIN) 875-125 MG per tablet Take 1 tablet by mouth every 12 (twelve) hours. 11/25/13   Teressa LowerVrinda Pickering, NP  HYDROcodone-acetaminophen (NORCO/VICODIN) 5-325 MG per tablet Take 1 tablet by mouth every 4 (four) hours as needed. 04/26/13   Garlon HatchetLisa M Sanders, PA-C  HYDROcodone-acetaminophen (NORCO/VICODIN) 5-325 MG per tablet Take 1-2 tablets by mouth every 4 (four) hours as needed. 11/25/13   Teressa LowerVrinda Pickering, NP  nitrofurantoin, macrocrystal-monohydrate, (MACROBID) 100 MG capsule Take 1 capsule (100 mg total) by mouth 2 (two) times daily. X 7 days 06/22/14   Kathrynn Speedobyn M Hess, PA-C  Prenatal Vit-Fe Fumarate-FA (PRENATAL COMPLETE) 14-0.4 MG TABS Take 14 mg by mouth daily. 06/22/14   Kathrynn Speedobyn M Hess, PA-C  promethazine (PHENERGAN) 25 MG tablet Take 1 tablet (25 mg total) by mouth every 6 (six) hours as needed for nausea or vomiting. 06/22/14   Kathrynn Speedobyn M Hess, PA-C    Allergies  Review of patient's allergies indicates no known allergies.  Triage Vitals: BP 143/86 mmHg  Pulse 62  Temp(Src) 98.2 F (36.8 C) (Oral)  Resp 16  Ht 5\' 7"  (1.702 m)  Wt 120 lb (54.432 kg)  BMI 18.79 kg/m2  SpO2 100%  Physical Exam  Constitutional: She is oriented to person, place, and time and well-developed,  well-nourished, and in no distress. No distress.  HENT:  Head: Normocephalic and atraumatic.  Neck: Normal range of motion. Neck supple.  Cardiovascular: Normal rate, regular rhythm and normal heart sounds.   No murmur heard. Pulmonary/Chest: Effort normal and breath sounds normal. No respiratory distress.  Abdominal: Soft. Bowel sounds are normal. She exhibits no distension. There is tenderness. There is no  rebound and no guarding.  There is mild TTP in all 4 quadrants.  Musculoskeletal: Normal range of motion. She exhibits no edema.  Neurological: She is alert and oriented to person, place, and time.  Skin: Skin is warm and dry. She is not diaphoretic.  Nursing note and vitals reviewed.   ED Course  Procedures   DIAGNOSTIC STUDIES: Oxygen Saturation is 100% on RA, normal by my interpretation.    COORDINATION OF CARE: 4:38 PM Discussed treatment plan which includes lab work, abdominal US, Zofran, and IVF with pt at bedside and pt agreed to plan.  Labs Review-  Labs Reviewed  URINALYSIS, ROUTINE W REFLEX MICROSCOPIC (NOT AT [facility]) - Abnormal; Notable for the following:    APPearance CLOUDY (*)    pH 8.5 (*)    Ketones, ur >80 (*)    Protein, ur 30 (*)    All other components within normal limits  URINE MICROSCOPIC-ADD ON - Abnormal; Notable for the following:    Squamous Epithelial / LPF MANY (*)    Bacteria, UA MANY (*)    All other components within normal limits  PREGNANCY, URINE  CBC WITH DIFFERENTIAL/PLATELET  COMPREHENSIVE METABOLIC PANEL  LIPASE, BLOOD    Imaging Review No results found.  EKG Interpretation None      MDM   Final diagnoses:  None   Patient presents with complaints of nausea and vomiting that started this morning. She has had multiple episodes over the past several months. Her workup today reveals no acute abnormality and she is feeling better with fluids and anti-emetics. Ultrasound was negative for gallstones or other acute intra-abdominal pathology. Patient does admit to daily use of marijuana and I suspect that this may well be the cause of her nausea and vomiting. I have advised her against marijuana use and have explained this to her. She will be given Phenergan which she can take as needed and is advised to follow-up with her primary Dr. in the near future.  I personally performed the services described in this documentation, which was  scribed in my presence. The recorded information has been reviewed and is accurate.       Geoffery Lyons, MD 10/15/14 [phone]

## 2014-10-15 NOTE — ED Notes (Signed)
Pt in c/o frequent episodes of emesis onset this am. Pt is actively retching in triage with no emesis noted.

## 2014-10-15 NOTE — Discharge Instructions (Signed)
Phenergan as prescribed as needed for nausea.  Refrain from marijuana use as this may be the cause of your nausea and vomiting.  Follow-up with your primary Dr. if not improving in the next week.   Nausea and Vomiting Nausea is a sick feeling that often comes before throwing up (vomiting). Vomiting is a reflex where stomach contents come out of your mouth. Vomiting can cause severe loss of body fluids (dehydration). Children and elderly adults can become dehydrated quickly, especially if they also have diarrhea. Nausea and vomiting are symptoms of a condition or disease. It is important to find the cause of your symptoms. CAUSES   Direct irritation of the stomach lining. This irritation can result from increased acid production (gastroesophageal reflux disease), infection, food poisoning, taking certain medicines (such as nonsteroidal anti-inflammatory drugs), alcohol use, or tobacco use.  Signals from the brain.These signals could be caused by a headache, heat exposure, an inner ear disturbance, increased pressure in the brain from injury, infection, a tumor, or a concussion, pain, emotional stimulus, or metabolic problems.  An obstruction in the gastrointestinal tract (bowel obstruction).  Illnesses such as diabetes, hepatitis, gallbladder problems, appendicitis, kidney problems, cancer, sepsis, atypical symptoms of a heart attack, or eating disorders.  Medical treatments such as chemotherapy and radiation.  Receiving medicine that makes you sleep (general anesthetic) during surgery. DIAGNOSIS Your caregiver may ask for tests to be done if the problems do not improve after a few days. Tests may also be done if symptoms are severe or if the reason for the nausea and vomiting is not clear. Tests may include:  Urine tests.  Blood tests.  Stool tests.  Cultures (to look for evidence of infection).  X-rays or other imaging studies. Test results can help your caregiver make decisions  about treatment or the need for additional tests. TREATMENT You need to stay well hydrated. Drink frequently but in small amounts.You may wish to drink water, sports drinks, clear broth, or eat frozen ice pops or gelatin dessert to help stay hydrated.When you eat, eating slowly may help prevent nausea.There are also some antinausea medicines that may help prevent nausea. HOME CARE INSTRUCTIONS   Take all medicine as directed by your caregiver.  If you do not have an appetite, do not force yourself to eat. However, you must continue to drink fluids.  If you have an appetite, eat a normal diet unless your caregiver tells you differently.  Eat a variety of complex carbohydrates (rice, wheat, potatoes, bread), lean meats, yogurt, fruits, and vegetables.  Avoid high-fat foods because they are more difficult to digest.  Drink enough water and fluids to keep your urine clear or pale yellow.  If you are dehydrated, ask your caregiver for specific rehydration instructions. Signs of dehydration may include:  Severe thirst.  Dry lips and mouth.  Dizziness.  Dark urine.  Decreasing urine frequency and amount.  Confusion.  Rapid breathing or pulse. SEEK IMMEDIATE MEDICAL CARE IF:   You have blood or brown flecks (like coffee grounds) in your vomit.  You have black or bloody stools.  You have a severe headache or stiff neck.  You are confused.  You have severe abdominal pain.  You have chest pain or trouble breathing.  You do not urinate at least once every 8 hours.  You develop cold or clammy skin.  You continue to vomit for longer than 24 to 48 hours.  You have a fever. MAKE SURE YOU:   Understand these instructions.  Will  watch your condition.  Will get help right away if you are not doing well or get worse. Document Released: 03/25/2005 Document Revised: 06/17/2011 Document Reviewed: 08/22/2010 [facility] Patient Information 2015 [place], [place]. This information  is not intended to replace advice given to you by your health care provider. Make sure you discuss any questions you have with your health care provider.

## 2014-10-15 NOTE — ED Notes (Signed)
Pt denies any abdominal pain. States she has been seen many times for similar episodes of emesis with no dx.

## 2014-11-11 ENCOUNTER — Emergency Department (HOSPITAL_BASED_OUTPATIENT_CLINIC_OR_DEPARTMENT_OTHER)
Admission: EM | Admit: 2014-11-11 | Discharge: 2014-11-11 | Disposition: A | Discharge status: Home / Self Care | Payer: Medicaid Other | Attending: Emergency Medicine | Admitting: Emergency Medicine

## 2014-11-11 ENCOUNTER — Encounter (HOSPITAL_BASED_OUTPATIENT_CLINIC_OR_DEPARTMENT_OTHER): Payer: Self-pay | Admitting: *Deleted

## 2014-11-11 DIAGNOSIS — Z720 Tobacco use: Secondary | ICD-10-CM | POA: Insufficient documentation

## 2014-11-11 DIAGNOSIS — N764 Abscess of vulva: Secondary | ICD-10-CM | POA: Insufficient documentation

## 2014-11-11 DIAGNOSIS — R102 Pelvic and perineal pain: Secondary | ICD-10-CM | POA: Diagnosis present

## 2014-11-11 MED ORDER — LIDOCAINE HCL (PF) 1 % IJ SOLN
5.0000 mL | Freq: Once | INTRAMUSCULAR | Status: AC
  Administered 2014-11-11: 5 mL
  Filled 2014-11-11: qty 5

## 2014-11-11 MED ORDER — SULFAMETHOXAZOLE-TRIMETHOPRIM 800-160 MG PO TABS: 1.0000 | ORAL_TABLET | Freq: Two times a day (BID) | ORAL | Status: AC

## 2014-11-11 NOTE — ED Provider Notes (Signed)
CSN: [id number]     Arrival date & time 11/11/14  1100 History   First MD Initiated Contact with Patient 11/11/14 1137     Chief Complaint  Patient presents with  . Insect Bite     (Consider location/radiation/quality/duration/timing/severity/associated sxs/prior Treatment) HPI Comments: Patient presents with a knot on her abdomen. She thinks it might be a spider bite. She noticed it yesterday but it got more swollen today. It's itching and painful. She denies any fevers or vomiting. She denies abdominal pain. She denies any history of similar symptoms.   History reviewed. No pertinent past medical history. History reviewed. No pertinent past surgical history. History reviewed. No pertinent family history. History  Substance Use Topics  . Smoking status: Current Some Day Smoker    Types: Cigarettes  . Smokeless tobacco: Not on file  . Alcohol Use: Yes     Comment: occ   OB History    No data available     Review of Systems  Constitutional: Negative for fever, chills, diaphoresis and fatigue.  HENT: Negative for congestion, rhinorrhea and sneezing.   Eyes: Negative.   Respiratory: Negative for cough, chest tightness and shortness of breath.   Cardiovascular: Negative for chest pain and leg swelling.  Gastrointestinal: Negative for nausea, vomiting, abdominal pain, diarrhea and blood in stool.  Genitourinary: Positive for vaginal pain. Negative for frequency, hematuria, flank pain and difficulty urinating.  Musculoskeletal: Negative for back pain and arthralgias.  Skin: Negative for rash.  Neurological: Negative for dizziness, speech difficulty, weakness, numbness and headaches.      Allergies  Review of patient's allergies indicates no known allergies.  Home Medications   Prior to Admission medications   Medication Sig Start Date End Date Taking? Authorizing Provider  sulfamethoxazole-trimethoprim (BACTRIM DS,SEPTRA DS) 800-160 MG per tablet Take 1 tablet by mouth 2  (two) times daily. 11/11/14 11/18/14  Rolan Bucco, MD   BP 128/83 mmHg  Pulse 87  Temp(Src) 98.5 F (36.9 C) (Oral)  Resp 18  SpO2 98%  LMP 10/26/2014 Physical Exam  Constitutional: She is oriented to person, place, and time. She appears well-developed and well-nourished.  HENT:  Head: Normocephalic and atraumatic.  Eyes: Pupils are equal, round, and reactive to light.  Neck: Normal range of motion. Neck supple.  Cardiovascular: Normal rate, regular rhythm and normal heart sounds.   Pulmonary/Chest: Effort normal and breath sounds normal. No respiratory distress. She has no wheezes. She has no rales. She exhibits no tenderness.  Abdominal: Soft. Bowel sounds are normal. There is no tenderness. There is no rebound and no guarding.  Genitourinary:  1 cm fluctuant abscess to the right vulva. There is mild surrounding swelling and erythema  Musculoskeletal: Normal range of motion. She exhibits no edema.  Lymphadenopathy:    She has no cervical adenopathy.  Neurological: She is alert and oriented to person, place, and time.  Skin: Skin is warm and dry. No rash noted.  Psychiatric: She has a normal mood and affect.    ED Course  INCISION AND DRAINAGE Date/Time: 11/11/2014 1:23 PM Performed by: Cydni Reddoch Authorized by: Rolan Bucco Consent: Verbal consent not obtained. Risks and benefits: risks, benefits and alternatives were discussed Consent given by: patient Type: abscess Body area: anogenital Location details: vulva Anesthesia: local infiltration Local anesthetic: lidocaine 1% without epinephrine Anesthetic total: 2 ml Patient sedated: no Scalpel size: 11 Incision type: elliptical Complexity: simple Drainage: purulent Drainage amount: scant Wound treatment: wound left open Patient tolerance: Patient tolerated the procedure well  with no immediate complications   (including critical care time) Labs Review Labs Reviewed - No data to display  Imaging Review No  results found.   EKG Interpretation None      MDM   Final diagnoses:  Abscess of vulva    Patient was started on Bactrim. She was advised to do warm compresses. She was advised to return if her symptoms worsen or are not improving.    Rolan Bucco, MD 11/11/14 1324

## 2014-11-11 NOTE — ED Notes (Signed)
Pt amb to triage with quick steady gait smiling in nad. Pt reports spider bite?? On her vagina x yesterday, itching and painful per pt.

## 2014-11-11 NOTE — Discharge Instructions (Signed)

## 2014-11-22 ENCOUNTER — Encounter (HOSPITAL_BASED_OUTPATIENT_CLINIC_OR_DEPARTMENT_OTHER): Payer: Self-pay | Admitting: Emergency Medicine

## 2014-11-22 ENCOUNTER — Emergency Department (HOSPITAL_BASED_OUTPATIENT_CLINIC_OR_DEPARTMENT_OTHER)
Admission: EM | Admit: 2014-11-22 | Discharge: 2014-11-22 | Disposition: A | Discharge status: Home / Self Care | Payer: Medicaid Other | Attending: Emergency Medicine | Admitting: Emergency Medicine

## 2014-11-22 DIAGNOSIS — Z792 Long term (current) use of antibiotics: Secondary | ICD-10-CM | POA: Diagnosis not present

## 2014-11-22 DIAGNOSIS — T378X5A Adverse effect of other specified systemic anti-infectives and antiparasitics, initial encounter: Secondary | ICD-10-CM | POA: Insufficient documentation

## 2014-11-22 DIAGNOSIS — Z720 Tobacco use: Secondary | ICD-10-CM | POA: Diagnosis not present

## 2014-11-22 DIAGNOSIS — L299 Pruritus, unspecified: Secondary | ICD-10-CM | POA: Diagnosis present

## 2014-11-22 DIAGNOSIS — T7840XA Allergy, unspecified, initial encounter: Secondary | ICD-10-CM

## 2014-11-22 MED ORDER — HYDROXYZINE HCL 25 MG PO TABS: 25.0000 mg | ORAL_TABLET | Freq: Four times a day (QID) | ORAL | Status: AC | PRN

## 2014-11-22 MED ORDER — CETIRIZINE HCL 5 MG/5ML PO SYRP
10.0000 mg | ORAL_SOLUTION | Freq: Once | ORAL | Status: AC
  Administered 2014-11-22: 10 mg via ORAL
  Filled 2014-11-22: qty 10

## 2014-11-22 MED ORDER — FAMOTIDINE 20 MG PO TABS
20.0000 mg | ORAL_TABLET | Freq: Once | ORAL | Status: AC
  Administered 2014-11-22: 20 mg via ORAL
  Filled 2014-11-22: qty 1

## 2014-11-22 MED ORDER — DEXAMETHASONE SODIUM PHOSPHATE 10 MG/ML IJ SOLN
INTRAMUSCULAR | Status: AC
  Filled 2014-11-22: qty 1

## 2014-11-22 MED ORDER — DEXAMETHASONE 10 MG/ML FOR PEDIATRIC ORAL USE
10.0000 mg | Freq: Once | INTRAMUSCULAR | Status: AC
  Administered 2014-11-22: 10 mg via ORAL
  Filled 2014-11-22: qty 1

## 2014-11-22 NOTE — ED Provider Notes (Addendum)
CSN: [id number]     Arrival date & time 11/22/14  0341 History   First MD Initiated Contact with Patient 11/22/14 0434     Chief Complaint  Patient presents with  . Itching      (Consider location/radiation/quality/duration/timing/severity/associated sxs/prior Treatment) HPI  This is a 19 year old female who was seen on August 5 for an abscess of the vulva. She had an I&D performed and was placed on a 10 day course of Bactrim. She has not completed the Bactrim. She is here now with a 2 day history of generalized itching associated with a fine, erythematous, follicular rash primarily of the legs. The itching is generalized. It is moderate to severe. She has not been taking anything for it. She denies throat swelling, shortness of breath or wheezing. She denies nausea, vomiting or diarrhea. She states the vulva abscess has healed well.  History reviewed. No pertinent past medical history. History reviewed. No pertinent past surgical history. History reviewed. No pertinent family history. Social History  Substance Use Topics  . Smoking status: Current Some Day Smoker    Types: Cigarettes  . Smokeless tobacco: None  . Alcohol Use: Yes     Comment: occ   OB History    No data available     Review of Systems  All other systems reviewed and are negative.   Allergies  Review of patient's allergies indicates no known allergies.  Home Medications   Prior to Admission medications   Medication Sig Start Date End Date Taking? Authorizing Provider  sulfamethoxazole-trimethoprim (BACTRIM,SEPTRA) 400-80 MG per tablet Take 1 tablet by mouth 2 (two) times daily.   Yes Historical Provider, MD   BP 130/59 mmHg  Pulse 77  Temp(Src) 98.2 F (36.8 C) (Oral)  Resp 16  Ht  (1.676 m)  Wt 110 lb (49.896 kg)  BMI 17.76 kg/m2  SpO2 98%  LMP 10/26/2014   Physical Exam  General: Well-d3eveloped, well-nourished female in no acute distress; appearance consistent with age of record HENT:  normocephalic; atraumatic Eyes: pupils equal, round and reactive to light; extraocular muscles intact Neck: supple Heart: regular rate and rhythm Lungs: clear to auscultation bilaterally Abdomen: soft; nondistended Extremities: No deformity; full range of motion; pulses normal Neurologic: Awake, alert and oriented; motor function intact in all extremities and symmetric; no facial droop Skin: Warm and dry; fine, erythematous, follicular, macular rash of lower extremities Psychiatric: Normal mood and affect    ED Course  Procedures (including critical care time)   MDM  Suspect allergic reaction to Bactrim. We will list Bactrim as an allergy in the future. She was advised to discontinue the Bactrim.   Paula Libra, MD 11/22/14 9147  Paula Libra, MD 11/22/14 [phone]

## 2014-11-22 NOTE — ED Notes (Signed)
Patient reports that she has been itching all over for the last 2 -3 days

## 2015-02-02 ENCOUNTER — Emergency Department (HOSPITAL_BASED_OUTPATIENT_CLINIC_OR_DEPARTMENT_OTHER)
Admission: EM | Admit: 2015-02-02 | Discharge: 2015-02-02 | Disposition: A | Discharge status: Home / Self Care | Payer: Medicaid Other | Attending: Emergency Medicine | Admitting: Emergency Medicine

## 2015-02-02 ENCOUNTER — Encounter (HOSPITAL_BASED_OUTPATIENT_CLINIC_OR_DEPARTMENT_OTHER): Payer: Self-pay | Admitting: *Deleted

## 2015-02-02 DIAGNOSIS — R1013 Epigastric pain: Secondary | ICD-10-CM | POA: Diagnosis not present

## 2015-02-02 DIAGNOSIS — R112 Nausea with vomiting, unspecified: Secondary | ICD-10-CM | POA: Insufficient documentation

## 2015-02-02 DIAGNOSIS — Z87891 Personal history of nicotine dependence: Secondary | ICD-10-CM | POA: Diagnosis not present

## 2015-02-02 DIAGNOSIS — Z3202 Encounter for pregnancy test, result negative: Secondary | ICD-10-CM | POA: Diagnosis not present

## 2015-02-02 DIAGNOSIS — R1012 Left upper quadrant pain: Secondary | ICD-10-CM | POA: Insufficient documentation

## 2015-02-02 LAB — COMPREHENSIVE METABOLIC PANEL
ALT: 25 U/L (ref 14–54)
AST: 38 U/L (ref 15–41)
Albumin: 4.8 g/dL (ref 3.5–5.0)
Alkaline Phosphatase: 43 U/L (ref 38–126)
Anion gap: 8 (ref 5–15)
BILIRUBIN TOTAL: 0.6 mg/dL (ref 0.3–1.2)
BUN: 8 mg/dL (ref 6–20)
CALCIUM: 9 mg/dL (ref 8.9–10.3)
CO2: 23 mmol/L (ref 22–32)
CREATININE: 0.44 mg/dL (ref 0.44–1.00)
Chloride: 104 mmol/L (ref 101–111)
GFR calc Af Amer: >60 mL/min (ref >60)
GFR calc non Af Amer: >60 mL/min (ref >60)
GLUCOSE: 103 mg/dL — AB (ref 65–99)
Potassium: 3.6 mmol/L (ref 3.5–5.1)
Sodium: 135 mmol/L (ref 135–145)
Total Protein: 8.4 g/dL — ABNORMAL HIGH (ref 6.5–8.1)

## 2015-02-02 LAB — CBC WITH DIFFERENTIAL/PLATELET
BASOS PCT: 0 %
Basophils Absolute: 0 10*3/uL (ref 0.0–0.1)
EOS ABS: 0 10*3/uL (ref 0.0–0.7)
Eosinophils Relative: 0 %
HCT: 36.3 % (ref 36.0–46.0)
Hemoglobin: 12 g/dL (ref 12.0–15.0)
Lymphocytes Relative: 14 %
Lymphs Abs: 0.9 10*3/uL (ref 0.7–4.0)
MCH: 29.8 pg (ref 26.0–34.0)
MCHC: 33.1 g/dL (ref 30.0–36.0)
MCV: 90.1 fL (ref 78.0–100.0)
MONO ABS: 0.1 10*3/uL (ref 0.1–1.0)
MONOS PCT: 2 %
Neutro Abs: 5 10*3/uL (ref 1.7–7.7)
Neutrophils Relative %: 84 %
Platelets: 240 10*3/uL (ref 150–400)
RBC: 4.03 MIL/uL (ref 3.87–5.11)
RDW: 12.8 % (ref 11.5–15.5)
WBC: 6 10*3/uL (ref 4.0–10.5)

## 2015-02-02 LAB — PREGNANCY, URINE: PREG TEST UR: NEGATIVE

## 2015-02-02 LAB — LIPASE, BLOOD: LIPASE: 22 U/L (ref 11–51)

## 2015-02-02 MED ORDER — ONDANSETRON HCL 4 MG/2ML IJ SOLN
4.0000 mg | Freq: Once | INTRAMUSCULAR | Status: AC
Start: 2015-02-02 — End: 2015-02-02
  Administered 2015-02-02: 4 mg via INTRAVENOUS
  Filled 2015-02-02: qty 2

## 2015-02-02 MED ORDER — ONDANSETRON HCL 4 MG PO TABS: 4.0000 mg | ORAL_TABLET | Freq: Three times a day (TID) | ORAL | Status: AC | PRN

## 2015-02-02 MED ORDER — SODIUM CHLORIDE 0.9 % IV BOLUS (SEPSIS)
1000.0000 mL | Freq: Once | INTRAVENOUS | Status: AC
  Administered 2015-02-02: 1000 mL via INTRAVENOUS

## 2015-02-02 NOTE — ED Notes (Addendum)
Patient states she developed nausea and vomiting last night around 8pm.  States she has vomited approximately 20 times.  Denies abdominal pain or diarrhea. When the physician came into the room, the patient stated that she had been drinking several glasses of vodka and the drank an alcohol energy drink and then began to vomit.

## 2015-02-02 NOTE — Discharge Instructions (Signed)

## 2015-02-02 NOTE — ED Provider Notes (Signed)
CSN: [id number]     Arrival date & time 02/02/15  [id number] History   First MD Initiated Contact with Patient 02/02/15 [phone]     Chief Complaint  Patient presents with  . Emesis     (Consider location/radiation/quality/duration/timing/severity/associated sxs/prior Treatment) Patient is a 19 y.o. female presenting with vomiting.  Emesis Severity:  Severe Duration:  12 hours Timing:  Constant Quality:  Bilious material Progression:  Unchanged Chronicity:  New Context comment:  Heavy alcohol use last night.  began vomiting after drinking four loko. Relieved by:  Nothing Worsened by:  Nothing tried Associated symptoms: no abdominal pain, no diarrhea and no fever   Risk factors: alcohol use     History reviewed. No pertinent past medical history. History reviewed. No pertinent past surgical history. No family history on file. Social History  Substance Use Topics  . Smoking status: Former Smoker    Types: Cigars  . Smokeless tobacco: None  . Alcohol Use: Yes     Comment: occ   OB History    No data available     Review of Systems  Gastrointestinal: Positive for vomiting. Negative for abdominal pain and diarrhea.  All other systems reviewed and are negative.     Allergies  Bactrim  Home Medications   Prior to Admission medications   Medication Sig Start Date End Date Taking? Authorizing Provider  hydrOXYzine (ATARAX/VISTARIL) 25 MG tablet Take 1 tablet (25 mg total) by mouth every 6 (six) hours as needed for itching (may cause drowsiness). 11/22/14   Frenchie Pribyl Molpus, MD   BP 128/89 mmHg  Pulse 57  Temp(Src) 97.8 F (36.6 C) (Oral)  Resp 18  Ht 5\' 6"  (1.676 m)  Wt 120 lb (54.432 kg)  BMI 19.38 kg/m2  LMP 01/06/2015 (Exact Date) Physical Exam  Constitutional: She is oriented to person, place, and time. She appears well-developed and well-nourished. No distress.  HENT:  Head: Normocephalic and atraumatic.  Mouth/Throat: Oropharynx is clear and moist.  Eyes:  Conjunctivae are normal. Pupils are equal, round, and reactive to light. No scleral icterus.  Neck: Neck supple.  Cardiovascular: Normal rate, regular rhythm, normal heart sounds and intact distal pulses.   No murmur heard. Pulmonary/Chest: Effort normal and breath sounds normal. No stridor. No respiratory distress. She has no rales.  Abdominal: Soft. Bowel sounds are normal. She exhibits no distension. Tenderness: pt denied tenderness, but appeared to have tenderness in epigastrium and LUQ. There is no rigidity, no rebound and no guarding.  Musculoskeletal: Normal range of motion.  Neurological: She is alert and oriented to person, place, and time.  Skin: Skin is warm and dry. No rash noted.  Psychiatric: She has a normal mood and affect. Her behavior is normal.  Nursing note and vitals reviewed.   ED Course  Procedures (including critical care time) Labs Review Labs Reviewed  COMPREHENSIVE METABOLIC PANEL - Abnormal; Notable for the following:    Glucose, Bld 103 (*)    Total Protein 8.4 (*)    All other components within normal limits  CBC WITH DIFFERENTIAL/PLATELET  LIPASE, BLOOD  PREGNANCY, URINE    Imaging Review No results found. I have personally reviewed and evaluated these images and lab results as part of my medical decision-making.   EKG Interpretation None      MDM   Final diagnoses:  Non-intractable vomiting with nausea, vomiting of unspecified type    Vomiting after drinking alcohol heavily last night.  IV fluids, zofran, labs.   Labs unremarkable. Felt better.  Ambulated to the bathroom. Stable for discharge.  Blake Divine, MD 02/02/15 [phone]

## 2015-07-06 ENCOUNTER — Emergency Department (HOSPITAL_BASED_OUTPATIENT_CLINIC_OR_DEPARTMENT_OTHER)
Admission: EM | Admit: 2015-07-06 | Discharge: 2015-07-06 | Disposition: A | Discharge status: Home / Self Care | Payer: Medicaid Other | Attending: Emergency Medicine | Admitting: Emergency Medicine

## 2015-07-06 ENCOUNTER — Encounter (HOSPITAL_BASED_OUTPATIENT_CLINIC_OR_DEPARTMENT_OTHER): Payer: Self-pay | Admitting: Emergency Medicine

## 2015-07-06 DIAGNOSIS — Z3202 Encounter for pregnancy test, result negative: Secondary | ICD-10-CM | POA: Diagnosis not present

## 2015-07-06 DIAGNOSIS — R112 Nausea with vomiting, unspecified: Secondary | ICD-10-CM | POA: Diagnosis present

## 2015-07-06 DIAGNOSIS — Z87891 Personal history of nicotine dependence: Secondary | ICD-10-CM | POA: Diagnosis not present

## 2015-07-06 LAB — CBC WITH DIFFERENTIAL/PLATELET
Basophils Absolute: 0 10*3/uL (ref 0.0–0.1)
Basophils Relative: 0 %
Eosinophils Absolute: 0 10*3/uL (ref 0.0–0.7)
Eosinophils Relative: 0 %
HEMATOCRIT: 35.5 % — AB (ref 36.0–46.0)
HEMOGLOBIN: 12.1 g/dL (ref 12.0–15.0)
LYMPHS PCT: 10 %
Lymphs Abs: 0.6 10*3/uL — ABNORMAL LOW (ref 0.7–4.0)
MCH: 31.1 pg (ref 26.0–34.0)
MCHC: 34.1 g/dL (ref 30.0–36.0)
MCV: 91.3 fL (ref 78.0–100.0)
MONO ABS: 0.3 10*3/uL (ref 0.1–1.0)
MONOS PCT: 4 %
NEUTROS ABS: 5.3 10*3/uL (ref 1.7–7.7)
NEUTROS PCT: 86 %
Platelets: 208 10*3/uL (ref 150–400)
RBC: 3.89 MIL/uL (ref 3.87–5.11)
RDW: 13.2 % (ref 11.5–15.5)
WBC: 6.2 10*3/uL (ref 4.0–10.5)

## 2015-07-06 LAB — COMPREHENSIVE METABOLIC PANEL
ALBUMIN: 4.5 g/dL (ref 3.5–5.0)
ALK PHOS: 37 U/L — AB (ref 38–126)
ALT: 21 U/L (ref 14–54)
ANION GAP: 11 (ref 5–15)
AST: 31 U/L (ref 15–41)
BILIRUBIN TOTAL: 0.6 mg/dL (ref 0.3–1.2)
BUN: 13 mg/dL (ref 6–20)
CALCIUM: 8.8 mg/dL — AB (ref 8.9–10.3)
CO2: 23 mmol/L (ref 22–32)
Chloride: 103 mmol/L (ref 101–111)
Creatinine, Ser: 0.61 mg/dL (ref 0.44–1.00)
GFR calc Af Amer: >60 mL/min (ref >60)
GLUCOSE: 123 mg/dL — AB (ref 65–99)
POTASSIUM: 3.2 mmol/L — AB (ref 3.5–5.1)
Sodium: 137 mmol/L (ref 135–145)
TOTAL PROTEIN: 8.3 g/dL — AB (ref 6.5–8.1)

## 2015-07-06 LAB — LIPASE, BLOOD: LIPASE: 17 U/L (ref 11–51)

## 2015-07-06 LAB — HCG, QUANTITATIVE, PREGNANCY: hCG, Beta Chain, Quant, S: <1 m[IU]/mL (ref <5)

## 2015-07-06 MED ORDER — SODIUM CHLORIDE 0.9 % IV BOLUS (SEPSIS)
1000.0000 mL | Freq: Once | INTRAVENOUS | Status: AC
  Administered 2015-07-06: 1000 mL via INTRAVENOUS

## 2015-07-06 MED ORDER — POTASSIUM CHLORIDE CRYS ER 20 MEQ PO TBCR
40.0000 meq | EXTENDED_RELEASE_TABLET | Freq: Once | ORAL | Status: AC
  Administered 2015-07-06: 40 meq via ORAL
  Filled 2015-07-06: qty 2

## 2015-07-06 MED ORDER — METOCLOPRAMIDE HCL 10 MG PO TABS: 10.0000 mg | ORAL_TABLET | Freq: Four times a day (QID) | ORAL | Status: AC

## 2015-07-06 MED ORDER — ONDANSETRON HCL 4 MG/2ML IJ SOLN
4.0000 mg | Freq: Once | INTRAMUSCULAR | Status: AC
  Administered 2015-07-06: 4 mg via INTRAVENOUS
  Filled 2015-07-06: qty 2

## 2015-07-06 MED ORDER — METOCLOPRAMIDE HCL 5 MG/ML IJ SOLN
10.0000 mg | INTRAMUSCULAR | Status: AC
  Administered 2015-07-06: 10 mg via INTRAVENOUS
  Filled 2015-07-06: qty 2

## 2015-07-06 MED FILL — METOCLOPRAMIDE 10 MG TABLET: 10 | 3 days supply | Qty: 12 | Fill #0

## 2015-07-06 NOTE — Discharge Instructions (Signed)
Take the prescribed medication as directed.  Drink fluids to stay hydrated. Gentle diet to start, progress back to normal. Follow-up with your primary care doctor. Return to the ED for new or worsening symptoms.

## 2015-07-06 NOTE — ED Provider Notes (Signed)
CSN: [id number]     Arrival date & time 07/06/15  1326 History   First MD Initiated Contact with Patient 07/06/15 1331     Chief Complaint  Patient presents with  . Emesis     (Consider location/radiation/quality/duration/timing/severity/associated sxs/prior Treatment) Patient is a 20 y.o. female presenting with vomiting. The history is provided by the patient and medical records.  Emesis   20 year old female here with nausea and vomiting, beginning this morning. She estimates she has vomited 10-15 times. Has been liquid and stomach contents. No diarrhea. Denies abdominal pain, states she just feels queasy. Patient denies any sick contacts. No fever or chills. She does have some concern for pregnancy. Her last menstrual period was at the end of February. Reports her periods are usually very regular.  Denies vaginal discharge or abnormal bleeding.  She tried to take an ODT zofran without relief.  VSS.  History reviewed. No pertinent past medical history. History reviewed. No pertinent past surgical history. History reviewed. No pertinent family history. Social History  Substance Use Topics  . Smoking status: Former Smoker    Types: Cigars  . Smokeless tobacco: None  . Alcohol Use: Yes     Comment: occ   OB History    No data available     Review of Systems  Gastrointestinal: Positive for nausea and vomiting.  All other systems reviewed and are negative.     Allergies  Bactrim  Home Medications   Prior to Admission medications   Medication Sig Start Date End Date Taking? Authorizing Provider  hydrOXYzine (ATARAX/VISTARIL) 25 MG tablet Take 1 tablet (25 mg total) by mouth every 6 (six) hours as needed for itching (may cause drowsiness). 11/22/14   John Molpus, MD  ondansetron (ZOFRAN) 4 MG tablet Take 1 tablet (4 mg total) by mouth every 8 (eight) hours as needed for nausea or vomiting. 02/02/15   Blake Divine, MD   BP 139/71 mmHg  Pulse 88  Temp(Src) 98.1 F (36.7 C)  (Oral)  Resp 18  Ht  (1.676 m)  Wt 54.432 kg  BMI 19.38 kg/m2  SpO2 100%  LMP 06/06/2015   Physical Exam  Constitutional: She is oriented to person, place, and time. She appears well-developed and well-nourished.  HENT:  Head: Normocephalic and atraumatic.  Mouth/Throat: Oropharynx is clear and moist.  Eyes: Conjunctivae and EOM are normal. Pupils are equal, round, and reactive to light.  Neck: Normal range of motion. Neck supple.  Cardiovascular: Normal rate, regular rhythm and normal heart sounds.   Pulmonary/Chest: Effort normal and breath sounds normal. No respiratory distress. She has no wheezes.  Abdominal: Soft. Bowel sounds are normal. There is no tenderness. There is no guarding and no CVA tenderness.  Abdomen soft, nondistended, no tenderness  Musculoskeletal: Normal range of motion. She exhibits no edema.  Neurological: She is alert and oriented to person, place, and time.  Skin: Skin is warm. She is not diaphoretic.  Psychiatric: She has a normal mood and affect.  Nursing note and vitals reviewed.   ED Course  Procedures (including critical care time) Labs Review Labs Reviewed  CBC WITH DIFFERENTIAL/PLATELET - Abnormal; Notable for the following:    HCT 35.5 (*)    Lymphs Abs 0.6 (*)    All other components within normal limits  COMPREHENSIVE METABOLIC PANEL - Abnormal; Notable for the following:    Potassium 3.2 (*)    Glucose, Bld 123 (*)    Calcium 8.8 (*)    Total Protein 8.3 (*)  Alkaline Phosphatase 37 (*)    All other components within normal limits  LIPASE, BLOOD  HCG, QUANTITATIVE, PREGNANCY    Imaging Review No results found. I have personally reviewed and evaluated these images and lab results as part of my medical decision-making.   EKG Interpretation None      MDM   Final diagnoses:  Nausea and vomiting, vomiting of unspecified type   20 year old female here with nausea and vomiting.  Seen her multiple times for the same, hx  of habitual marijuana use.  Patient afebrile, non-toxic.  Abdominal exam is benign. Patient does have concern for pregnancy as it is time for her menstrual cycle. Labs sent and are overall reassuring. She does have a slight hypokalemia which was replaced here. Her hCG quantitative is <1.  Patient was treated with IV fluids and anti-emetics with resolution of symptoms. She is feeling better and would like to go home.  Will d/c home with reglan as she stated this worked better for her than zofran.  Discussed plan with patient, he/she acknowledged understanding and agreed with plan of care.  Return precautions given for new or worsening symptoms.  Garlon HatchetLisa M Sanders, PA-C 07/06/15 1616  Vanetta MuldersScott Zackowski, MD 07/07/15 [phone]

## 2015-07-06 NOTE — ED Notes (Signed)
Pt reports acute onset of vomiting this am reports that she has vomited about 10-15 times, no diarrhea, thinks she could be pregnant

## 2015-07-06 NOTE — ED Notes (Signed)
Pt unable to void at this time, request to wait to provide urine sample

## 2015-07-06 NOTE — ED Notes (Signed)
MD at bedside. 

## 2015-10-29 IMAGING — CR DG WRIST COMPLETE 3+V*R*
4 series · 4 of 4 positions shown · non-contrast
Comparison: None.

CLINICAL DATA: Pain.

EXAM:
RIGHT WRIST - COMPLETE 3+ VIEW

[x wrist pa right]
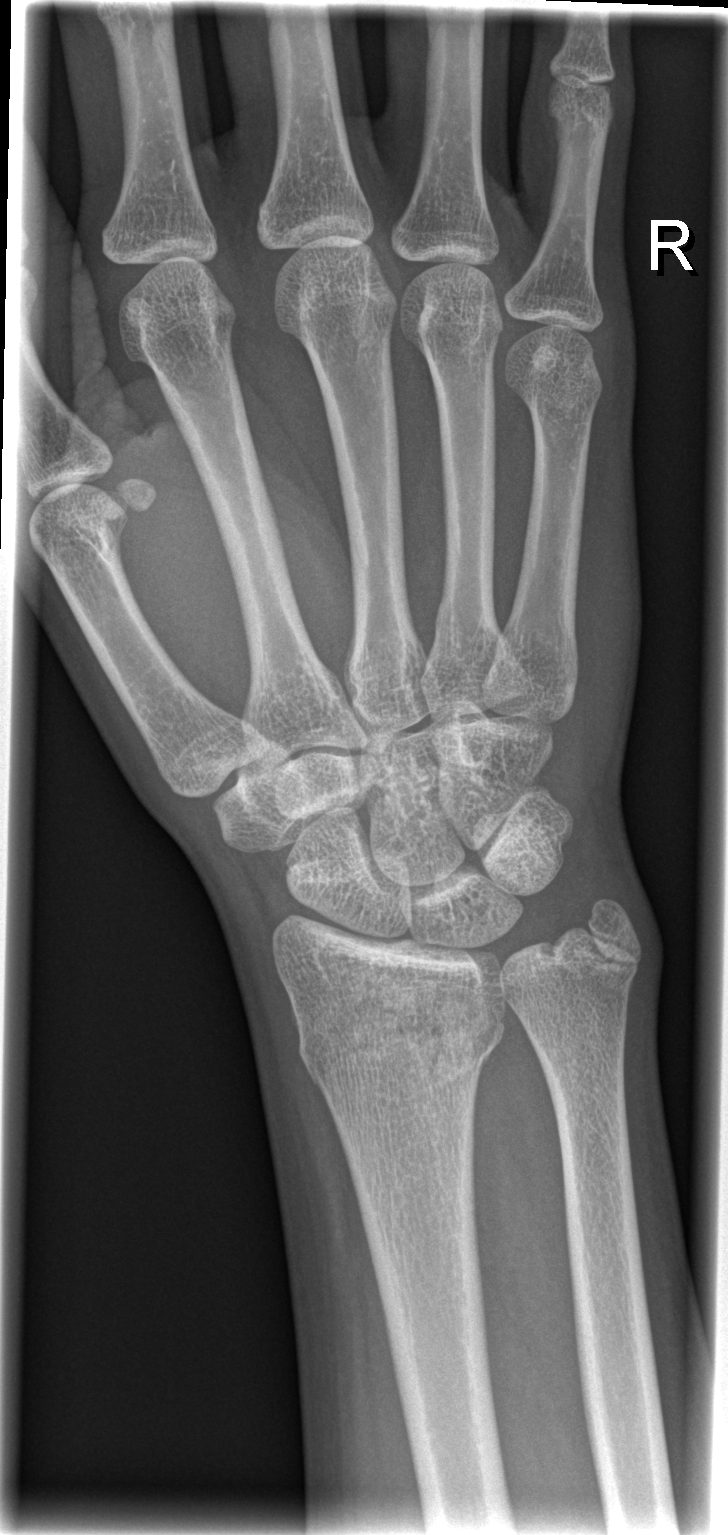

[x wrist obl right]
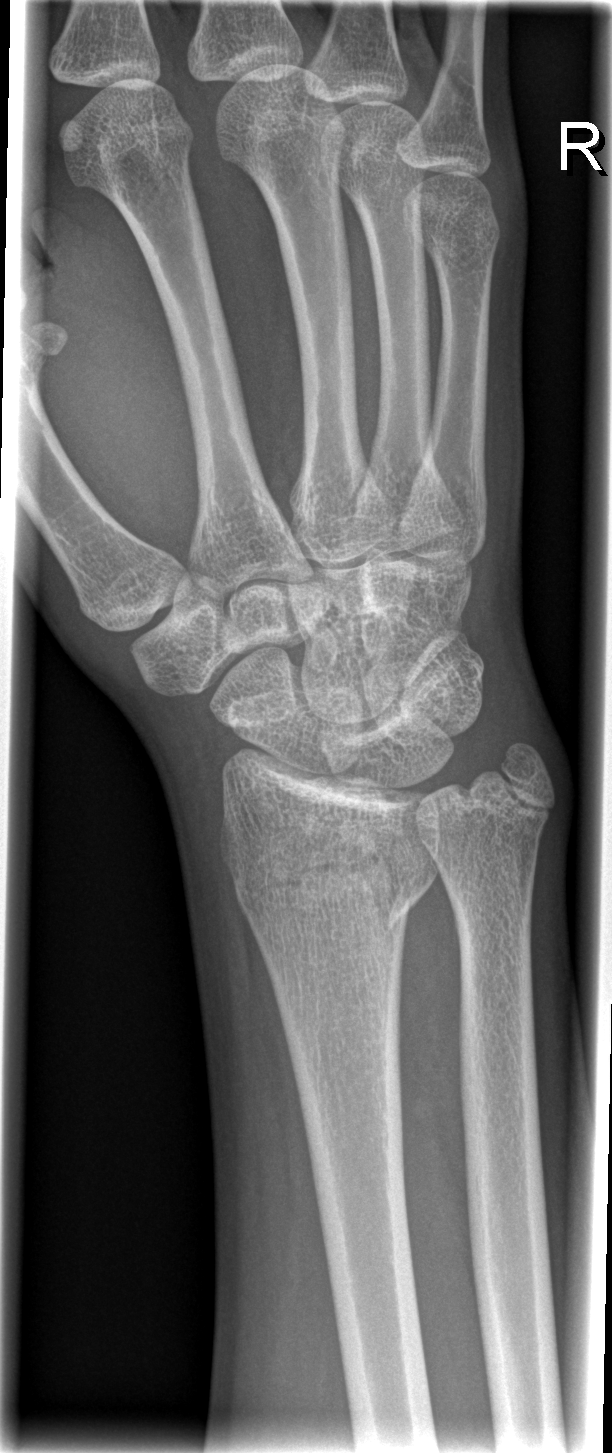

[x wrist lat right]
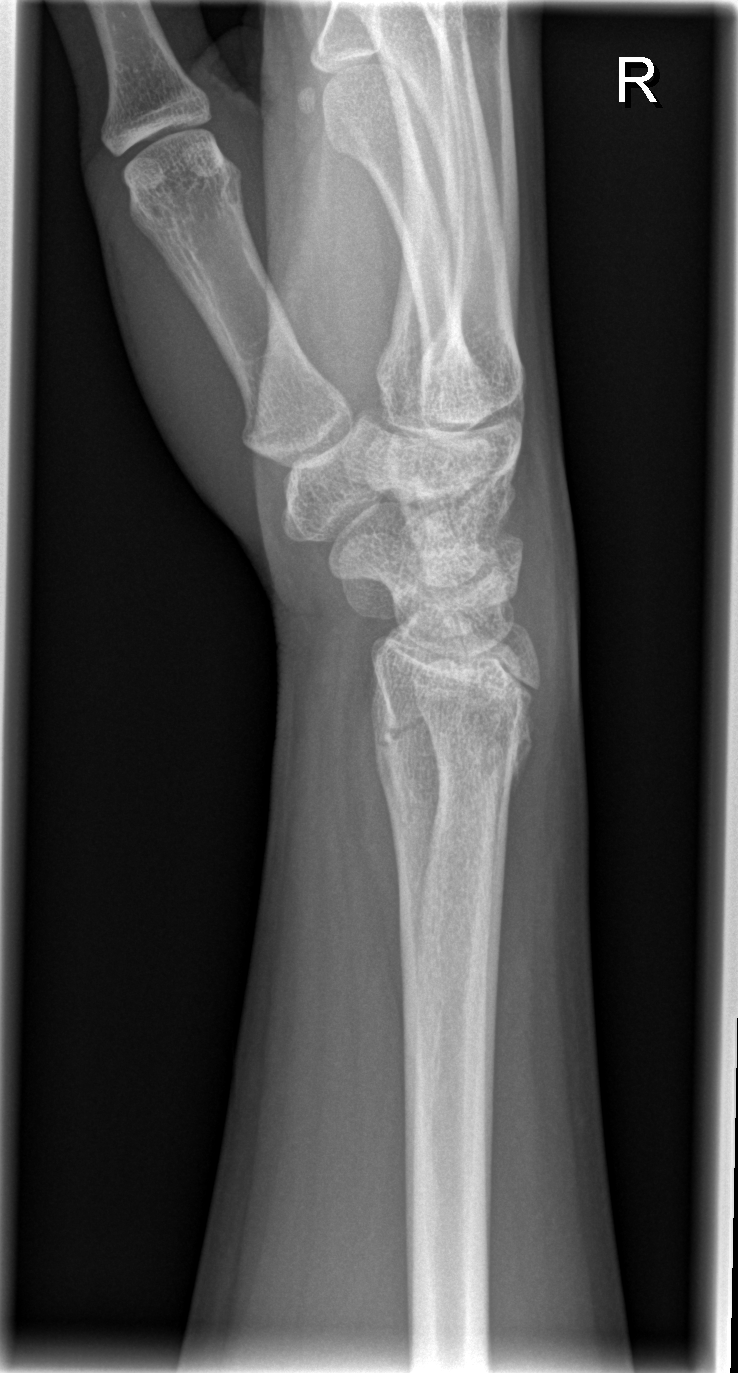

[x navicular]
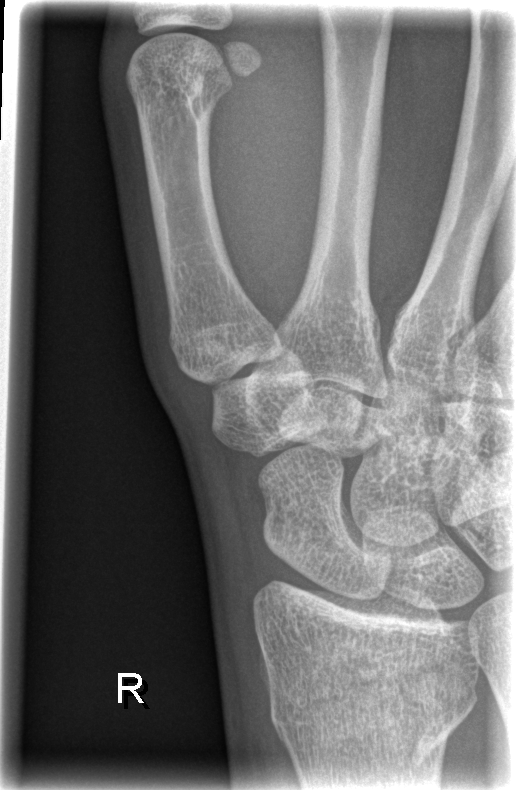

[4 of 4 positions shown; findings below may reference images not displayed]

FINDINGS: Comminuted fracture of the distal right radial metaphysis with
possible extension into the radiocarpal joint space noted. No other
focal abnormalities identified. No evidence of dislocation.
IMPRESSION: Distal right radial metaphysis nondisplaced fracture. Fracture may
extend into the radiocarpal joint space .

## 2016-03-04 ENCOUNTER — Encounter (HOSPITAL_BASED_OUTPATIENT_CLINIC_OR_DEPARTMENT_OTHER): Payer: Self-pay | Admitting: Emergency Medicine

## 2016-03-04 ENCOUNTER — Emergency Department (HOSPITAL_BASED_OUTPATIENT_CLINIC_OR_DEPARTMENT_OTHER)
Admission: EM | Admit: 2016-03-04 | Discharge: 2016-03-04 | Disposition: A | Discharge status: Home / Self Care | Payer: Medicaid Other | Attending: Emergency Medicine | Admitting: Emergency Medicine

## 2016-03-04 DIAGNOSIS — R112 Nausea with vomiting, unspecified: Secondary | ICD-10-CM

## 2016-03-04 DIAGNOSIS — Z3A10 10 weeks gestation of pregnancy: Secondary | ICD-10-CM | POA: Insufficient documentation

## 2016-03-04 DIAGNOSIS — Z87891 Personal history of nicotine dependence: Secondary | ICD-10-CM | POA: Insufficient documentation

## 2016-03-04 DIAGNOSIS — O219 Vomiting of pregnancy, unspecified: Secondary | ICD-10-CM | POA: Insufficient documentation

## 2016-03-04 DIAGNOSIS — Z3490 Encounter for supervision of normal pregnancy, unspecified, unspecified trimester: Secondary | ICD-10-CM

## 2016-03-04 LAB — URINALYSIS, ROUTINE W REFLEX MICROSCOPIC
Bilirubin Urine: NEGATIVE
GLUCOSE, UA: NEGATIVE mg/dL
Hgb urine dipstick: NEGATIVE
Ketones, ur: >80 mg/dL — AB
LEUKOCYTES UA: NEGATIVE
Nitrite: NEGATIVE
PROTEIN: 30 mg/dL — AB
SPECIFIC GRAVITY, URINE: 1.023 (ref 1.005–1.030)
pH: 6.5 (ref 5.0–8.0)

## 2016-03-04 LAB — COMPREHENSIVE METABOLIC PANEL
ALBUMIN: 4.3 g/dL (ref 3.5–5.0)
ALK PHOS: 26 U/L — AB (ref 38–126)
ALT: 11 U/L — AB (ref 14–54)
AST: 19 U/L (ref 15–41)
Anion gap: 8 (ref 5–15)
BUN: 8 mg/dL (ref 6–20)
CALCIUM: 9.4 mg/dL (ref 8.9–10.3)
CO2: 23 mmol/L (ref 22–32)
CREATININE: 0.45 mg/dL (ref 0.44–1.00)
Chloride: 104 mmol/L (ref 101–111)
GFR calc non Af Amer: >60 mL/min (ref >60)
GLUCOSE: 111 mg/dL — AB (ref 65–99)
Potassium: 3.2 mmol/L — ABNORMAL LOW (ref 3.5–5.1)
SODIUM: 135 mmol/L (ref 135–145)
Total Bilirubin: 0.3 mg/dL (ref 0.3–1.2)
Total Protein: 7.7 g/dL (ref 6.5–8.1)

## 2016-03-04 LAB — URINE MICROSCOPIC-ADD ON

## 2016-03-04 LAB — LIPASE, BLOOD: Lipase: 32 U/L (ref 11–51)

## 2016-03-04 LAB — CBC
HEMATOCRIT: 35.3 % — AB (ref 36.0–46.0)
HEMOGLOBIN: 12 g/dL (ref 12.0–15.0)
MCH: 31.4 pg (ref 26.0–34.0)
MCHC: 34 g/dL (ref 30.0–36.0)
MCV: 92.4 fL (ref 78.0–100.0)
Platelets: 213 10*3/uL (ref 150–400)
RBC: 3.82 MIL/uL — AB (ref 3.87–5.11)
RDW: 13 % (ref 11.5–15.5)
WBC: 7.3 10*3/uL (ref 4.0–10.5)

## 2016-03-04 LAB — PREGNANCY, URINE: Preg Test, Ur: POSITIVE — AB

## 2016-03-04 MED ORDER — ONDANSETRON HCL 4 MG/2ML IJ SOLN
4.0000 mg | Freq: Once | INTRAMUSCULAR | Status: AC
  Administered 2016-03-04: 4 mg via INTRAVENOUS
  Filled 2016-03-04: qty 2

## 2016-03-04 MED ORDER — SODIUM CHLORIDE 0.9 % IV SOLN
1000.0000 mL | Freq: Once | INTRAVENOUS | Status: AC
  Administered 2016-03-04: 1000 mL via INTRAVENOUS

## 2016-03-04 MED ORDER — METOCLOPRAMIDE HCL 5 MG/ML IJ SOLN
10.0000 mg | Freq: Once | INTRAMUSCULAR | Status: AC
  Administered 2016-03-04: 10 mg via INTRAVENOUS
  Filled 2016-03-04: qty 2

## 2016-03-04 MED ORDER — SODIUM CHLORIDE 0.9 % IV SOLN: 1000.0000 mL | INTRAVENOUS | Status: DC

## 2016-03-04 NOTE — ED Notes (Signed)
Denies any N/V at this time

## 2016-03-04 NOTE — ED Notes (Signed)
Patient denies pain and is resting comfortably.  

## 2016-03-04 NOTE — Discharge Instructions (Signed)
What can I do to feel better if I have nausea and vomiting of pregnancy? Diet and lifestyle changes may help you feel better. You may need to try more than one of these suggestions:  Take a multivitamin.  Try eating dry toast or crackers in the morning before you get out of bed to avoid moving around on an empty stomach.  Drink fluids often.  Avoid smells that bother you.  Eat small, frequent meals instead of three large meals.  Try bland foods. For example, the ?BRATT? diet (bananas, rice, applesauce, toast, and tea) is low in fat and easy to digest.  Try ginger ale made with real ginger, ginger tea made from fresh grated ginger, ginger capsules, and ginger candies.  If you do vomit a lot, it can cause some of your tooth enamel to wear away. This happens because your stomach contains a lot of acid. Rinsing your mouth with a teaspoon of baking soda dissolved in a cup of water may help neutralize the acid and protect your teeth.  Is there medical treatment for nausea and vomiting of pregnancy? If diet and lifestyle changes do not help your symptoms, or if you have severe nausea and vomiting of pregnancy, medical treatment may be needed. If other medical conditions are ruled out, certain medications can be given to treat nausea and vomiting of pregnancy: Vitamin B6 and doxylamine--Vitamin B6 is a safe, over-the-counter treatment that may be tried first. Doxylamine, a medication found in over-the-counter sleep aids, may be added if vitamin B6 alone does not relieve symptoms. A prescription drug that combines vitamin B6 and doxylamine is available. Both drugs--taken alone or together--have been found to be safe to take during pregnancy and have no harmful effects on the baby.  ?Antiemetic? drugs--If vitamin B6 and doxylamine do not work, ?antiemetic? drugs may be prescribed. These drugs prevent vomiting. Many antiemetic drugs have been shown to be safe to use during pregnancy. Others have conflicting or  limited safety information. You and your obstetrician or other members of your health care team can discuss all of these factors to determine the best treatment for your personal situation.

## 2016-03-04 NOTE — ED Notes (Signed)
Patient got dressed and left room on own. Upon entering room to d/c the patient intact IV on the bed as well as gown. Patient was found in the waiting room, VSS  - d/c instruction discussed and acknowledged. Unable to have the patient sign

## 2016-03-04 NOTE — ED Triage Notes (Signed)
Patient states that she was up 5 am and started to vomit - the patient is throwing up phglem. Patient is also coughing

## 2016-03-04 NOTE — ED Provider Notes (Signed)
[facility] Provider Note   CSN: [id number] Arrival date & time: 03/04/16  0746     History   Chief Complaint Chief Complaint  Patient presents with  . Emesis    HPI Annette Bennett is a 20 y.o. female.  HPI Patient presents to the emergency department with complaints of nausea and vomiting since 5:00 this morning.  She reports inability keep anything down.  Yesterday she was without symptoms.  She reports no diarrhea.  No hematemesis.  No fevers or chills.  Denies abdominal pain at this time.  She reports some productive cough without shortness of breath.  She reports her last menstrual cycle was in September and she believes that she could be pregnant.  She's had no abnormal vaginal bleeding since then.  She has never been pregnant before per the patient  History reviewed. No pertinent past medical history.  There are no active problems to display for this patient.   History reviewed. No pertinent surgical history.  OB History    No data available       Home Medications    Prior to Admission medications   Medication Sig Start Date End Date Taking? Authorizing Provider  hydrOXYzine (ATARAX/VISTARIL) 25 MG tablet Take 1 tablet (25 mg total) by mouth every 6 (six) hours as needed for itching (may cause drowsiness). 11/22/14   John Molpus, MD  metoCLOPramide (REGLAN) 10 MG tablet Take 1 tablet (10 mg total) by mouth every 6 (six) hours. 07/06/15   Garlon HatchetLisa M Sanders, PA-C  ondansetron (ZOFRAN) 4 MG tablet Take 1 tablet (4 mg total) by mouth every 8 (eight) hours as needed for nausea or vomiting. 02/02/15   Blake DivineJohn Wofford, MD    Family History History reviewed. No pertinent family history.  Social History Social History  Substance Use Topics  . Smoking status: Former Smoker    Types: Cigars  . Smokeless tobacco: Never Used  . Alcohol use Yes     Comment: occ     Allergies   Bactrim [sulfamethoxazole-trimethoprim]   Review of Systems Review of Systems    All other systems reviewed and are negative.    Physical Exam Updated Vital Signs BP (!) 129/118 (BP Location: Left Arm)   Temp 98.1 F (36.7 C) (Oral)   Resp 18   Ht 5\' 6"  (1.676 m)   Wt 125 lb (56.7 kg)   LMP 12/20/2015   SpO2 100%   BMI 20.18 kg/m   Physical Exam  Constitutional: She is oriented to person, place, and time. She appears well-developed and well-nourished. No distress.  HENT:  Head: Normocephalic and atraumatic.  Eyes: EOM are normal.  Neck: Normal range of motion.  Cardiovascular: Normal rate, regular rhythm and normal heart sounds.   Pulmonary/Chest: Effort normal and breath sounds normal.  Abdominal: Soft. She exhibits no distension. There is no tenderness.  Musculoskeletal: Normal range of motion.  Neurological: She is alert and oriented to person, place, and time.  Skin: Skin is warm and dry.  Psychiatric: She has a normal mood and affect. Judgment normal.  Nursing note and vitals reviewed.    ED Treatments / Results  Labs (all labs ordered are listed, but only abnormal results are displayed) Labs Reviewed  URINALYSIS, ROUTINE W REFLEX MICROSCOPIC (NOT AT [facility]) - Abnormal; Notable for the following:       Result Value   Ketones, ur >80 (*)    Protein, ur 30 (*)    All other components within normal limits  PREGNANCY,  URINE - Abnormal; Notable for the following:    Preg Test, Ur POSITIVE (*)    All other components within normal limits  CBC - Abnormal; Notable for the following:    RBC 3.82 (*)    HCT 35.3 (*)    All other components within normal limits  COMPREHENSIVE METABOLIC PANEL - Abnormal; Notable for the following:    Potassium 3.2 (*)    Glucose, Bld 111 (*)    ALT 11 (*)    Alkaline Phosphatase 26 (*)    All other components within normal limits  URINE MICROSCOPIC-ADD ON - Abnormal; Notable for the following:    Squamous Epithelial / LPF 0-5 (*)    Bacteria, UA FEW (*)    All other components within normal limits  LIPASE,  BLOOD    EKG  EKG Interpretation None       Radiology No results found.  Procedures Procedures (including critical care time)  Medications Ordered in ED Medications  0.9 %  sodium chloride infusion (1,000 mLs Intravenous New Bag/Given 03/04/16 0814)    Followed by  0.9 %  sodium chloride infusion (not administered)  ondansetron (ZOFRAN) injection 4 mg (4 mg Intravenous Given 03/04/16 0814)  metoCLOPramide (REGLAN) injection 10 mg (10 mg Intravenous Given 03/04/16 0814)     Initial Impression / Assessment and Plan / ED Course  I have reviewed the triage vital signs and the nursing notes.  Pertinent labs & imaging results that were available during my care of the patient were reviewed by me and considered in my medical decision making (see chart for details).  Clinical Course     10:29 AM Patient feels much better at this time.  Likely nausea vomiting related to positive pregnancy test.  Nothing to suggest ectopic pregnancy at this time.  Standard pregnancy precautions and recommendations given including daily prenatal vitamin.  ACOG recommendations given to patient regarding nausea vomiting associated with pregnancy including vitamin B 6 and doxylamine.   Patient understands return to the ER for new or worsening symptoms  Final Clinical Impressions(s) / ED Diagnoses   Final diagnoses:  Nausea and vomiting, intractability of vomiting not specified, unspecified vomiting type  Pregnancy, unspecified gestational age    [place] Pres[place] New Prescriptions   No medications on file     Azalia BilisKevin Chaunice Obie, MD 03/04/16 1030

## 2017-03-24 IMAGING — US US ABDOMEN COMPLETE
1 series · 14 of 25 positions shown · non-contrast
Comparison: None.

CLINICAL DATA: Nausea and vomiting.

EXAM:
ULTRASOUND ABDOMEN COMPLETE

[Series 1: us abdomen complete · 0.20mm/px · 14 of 78 slices shown]
[im 1/78]
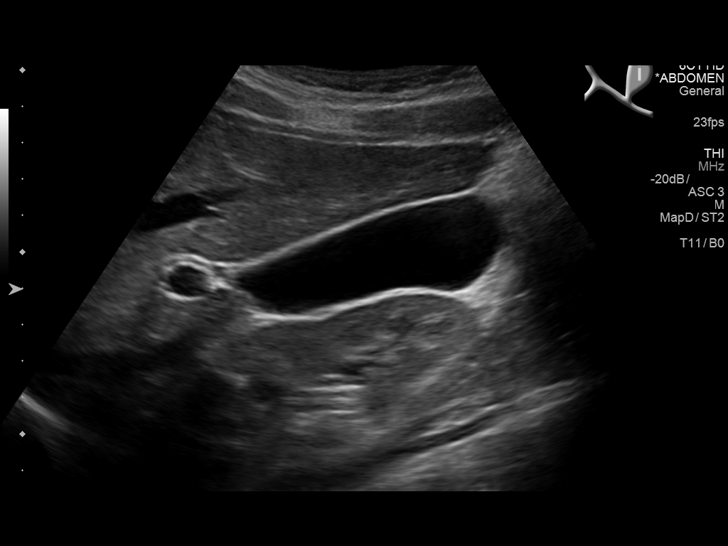
[im 7/78]
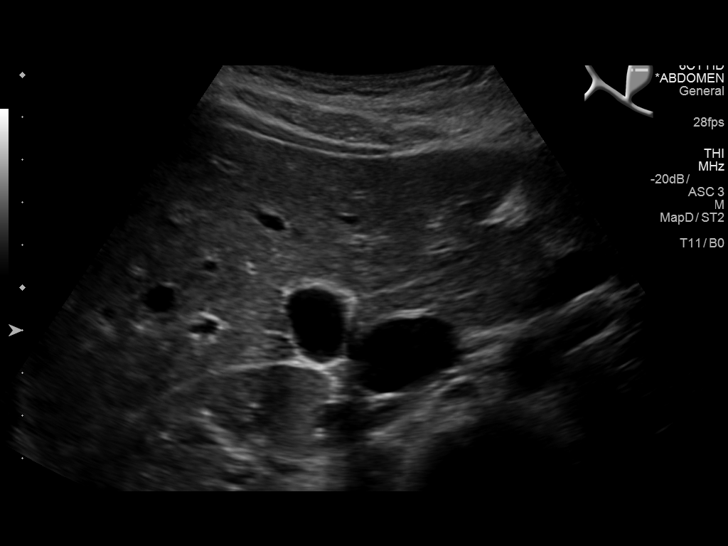
[im 13/78]
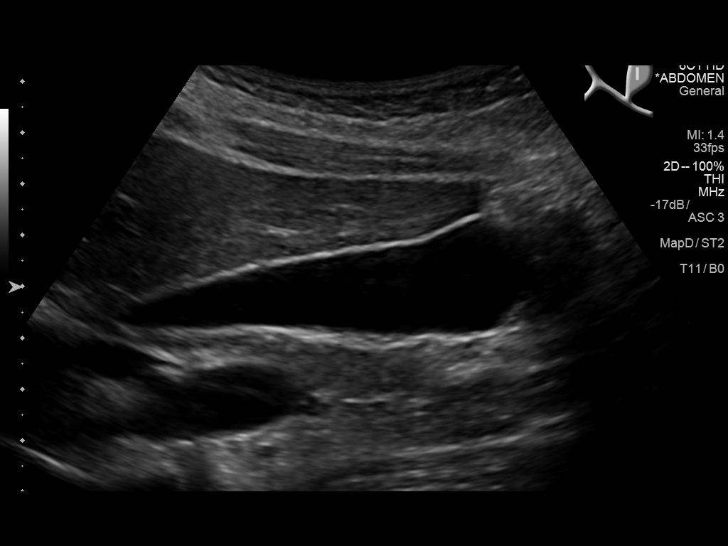
[im 20/78]
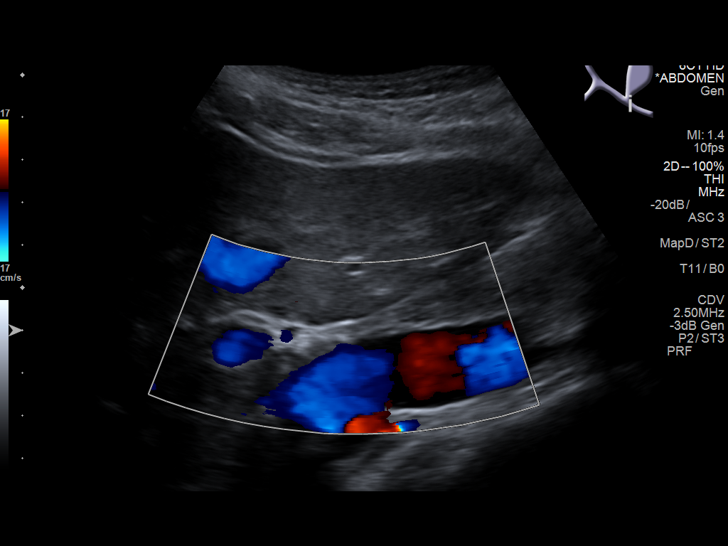
[im 26/78]
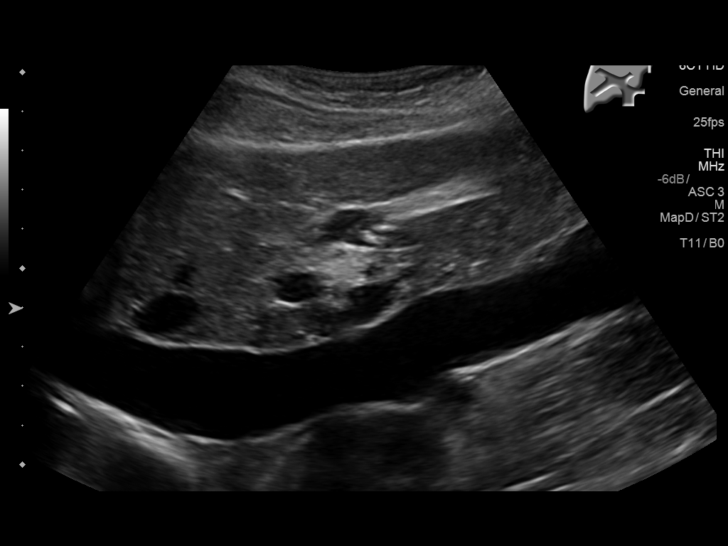
[im 29/78]
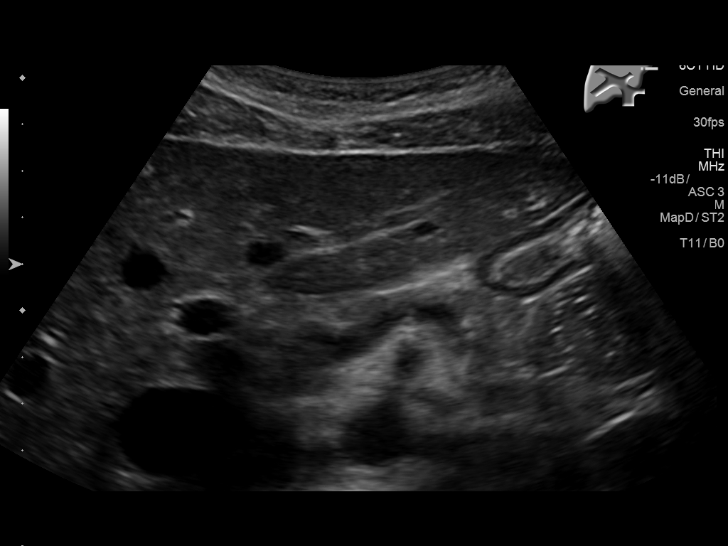
[im 36/78]
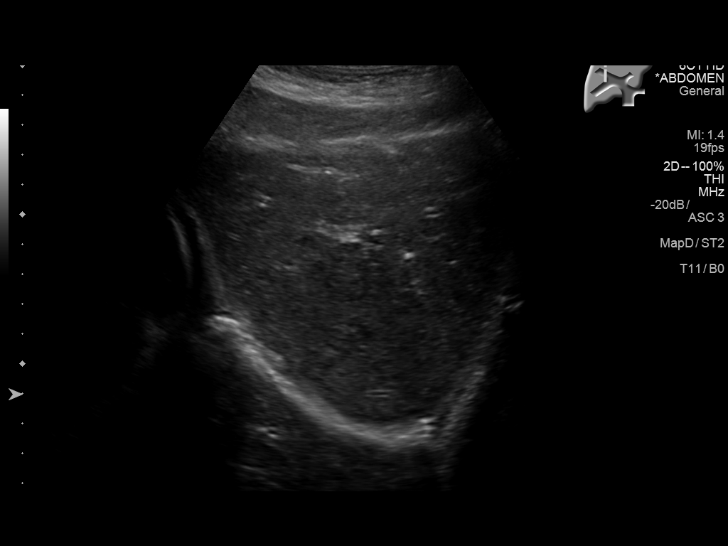
[im 42/78]
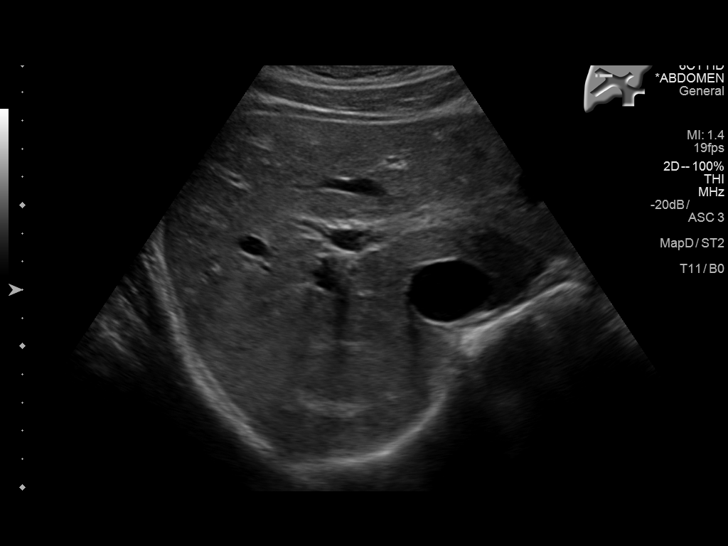
[im 49/78]
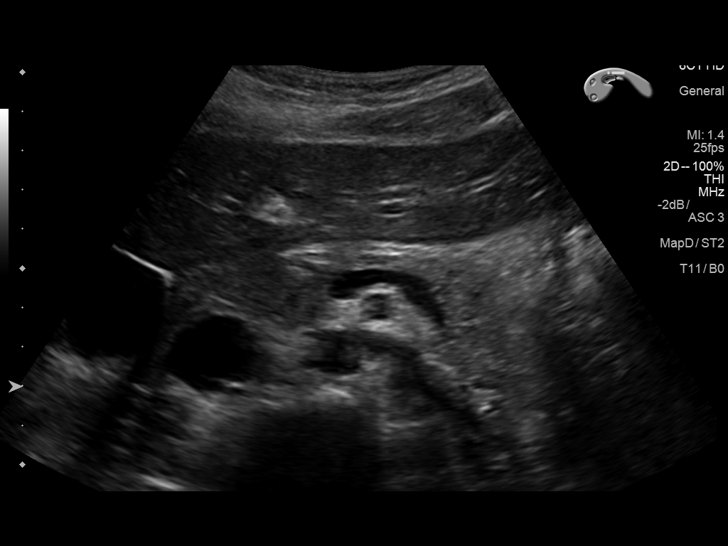
[im 52/78]
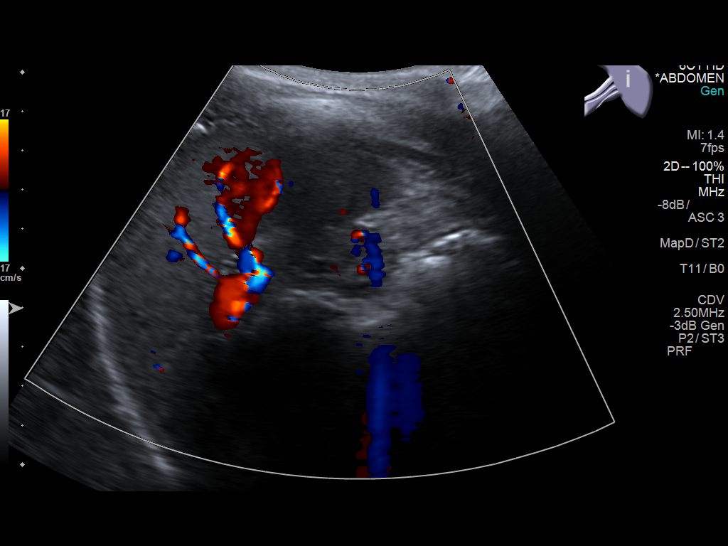
[im 58/78]
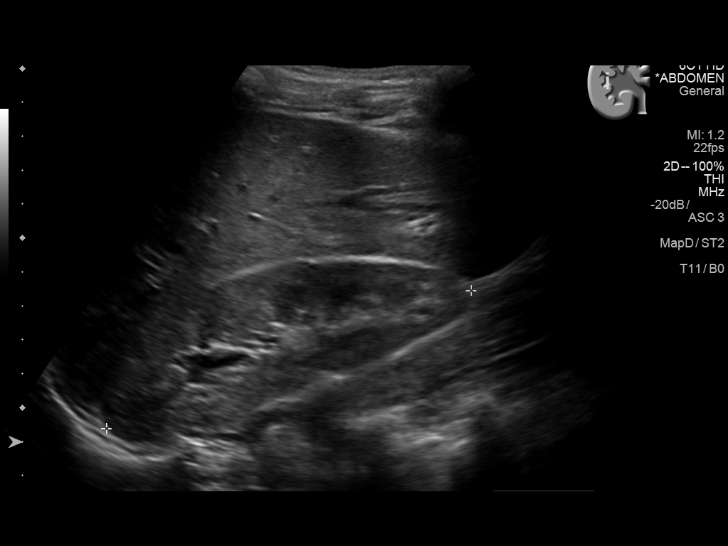
[im 65/78]
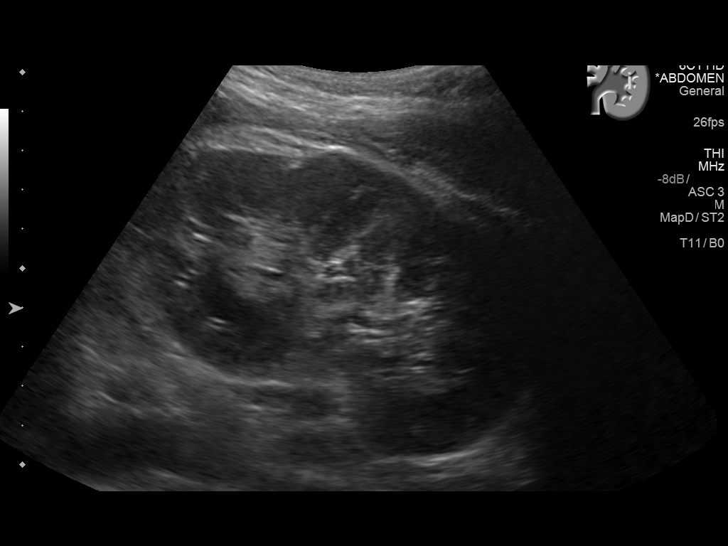
[im 71/78]
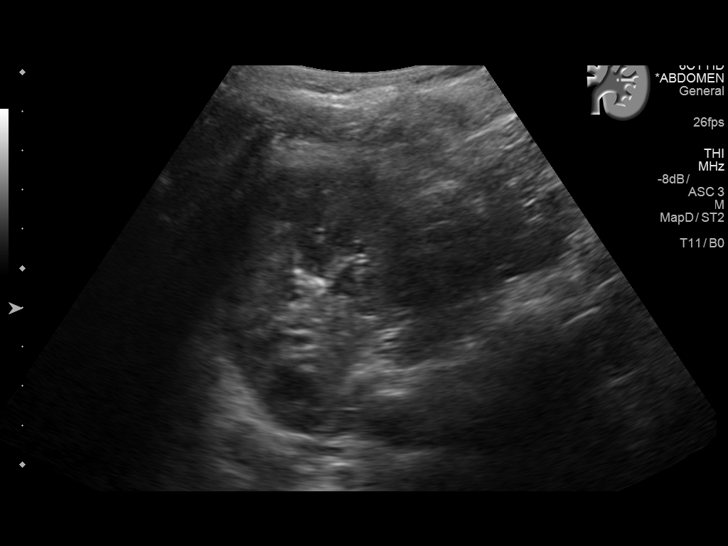
[im 78/78]
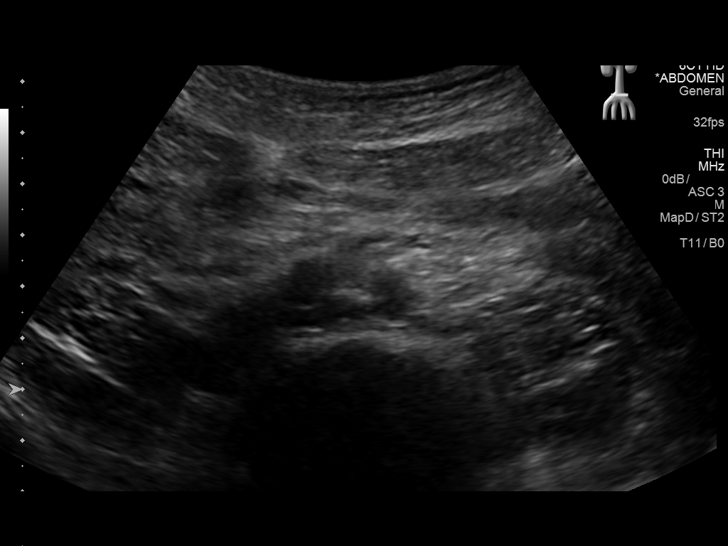

[14 of 25 positions shown; findings below may reference images not displayed]

FINDINGS: Gallbladder: No gallstones or wall thickening visualized. No
sonographic Murphy sign noted.

Common bile duct: Diameter: 1.4 mm which is within normal limits.

Liver: No focal lesion identified. Within normal limits in
parenchymal echogenicity.

IVC: No abnormality visualized.

Pancreas: Visualized portion unremarkable.

Spleen: Size and appearance within normal limits.

Right Kidney: Length: 11.5 cm. Echogenicity within normal limits. No
mass or hydronephrosis visualized.

Left Kidney: Length: 10.9 cm. Echogenicity within normal limits. No
mass or hydronephrosis visualized.

Abdominal aorta: No aneurysm visualized.

Other findings: None.
IMPRESSION: No abnormality seen in the abdomen.

## 2017-06-28 ENCOUNTER — Encounter (HOSPITAL_BASED_OUTPATIENT_CLINIC_OR_DEPARTMENT_OTHER): Payer: Self-pay | Admitting: Emergency Medicine

## 2017-06-28 ENCOUNTER — Other Ambulatory Visit: Payer: Self-pay

## 2017-06-28 ENCOUNTER — Emergency Department (HOSPITAL_BASED_OUTPATIENT_CLINIC_OR_DEPARTMENT_OTHER)
Admission: EM | Admit: 2017-06-28 | Discharge: 2017-06-28 | Disposition: A | Discharge status: Home / Self Care | Payer: Medicaid Other | Attending: Emergency Medicine | Admitting: Emergency Medicine

## 2017-06-28 DIAGNOSIS — R112 Nausea with vomiting, unspecified: Secondary | ICD-10-CM

## 2017-06-28 DIAGNOSIS — Z79899 Other long term (current) drug therapy: Secondary | ICD-10-CM | POA: Insufficient documentation

## 2017-06-28 DIAGNOSIS — Z87891 Personal history of nicotine dependence: Secondary | ICD-10-CM | POA: Insufficient documentation

## 2017-06-28 DIAGNOSIS — R1110 Vomiting, unspecified: Secondary | ICD-10-CM | POA: Diagnosis present

## 2017-06-28 LAB — URINALYSIS, ROUTINE W REFLEX MICROSCOPIC
BILIRUBIN URINE: NEGATIVE
GLUCOSE, UA: NEGATIVE mg/dL
Hgb urine dipstick: NEGATIVE
Leukocytes, UA: NEGATIVE
Nitrite: NEGATIVE
PH: 8 (ref 5.0–8.0)
PROTEIN: 100 mg/dL — AB
Specific Gravity, Urine: 1.02 (ref 1.005–1.030)

## 2017-06-28 LAB — URINALYSIS, MICROSCOPIC (REFLEX): RBC / HPF: NONE SEEN RBC/hpf (ref 0–5)

## 2017-06-28 LAB — PREGNANCY, URINE: Preg Test, Ur: NEGATIVE

## 2017-06-28 MED ORDER — FAMOTIDINE 20 MG PO TABS: 20.0000 mg | ORAL_TABLET | Freq: Two times a day (BID) | ORAL | 0 refills | Status: AC

## 2017-06-28 MED ORDER — DIPHENHYDRAMINE HCL 25 MG PO TABS: 25.0000 mg | ORAL_TABLET | Freq: Four times a day (QID) | ORAL | 0 refills | Status: AC | PRN

## 2017-06-28 MED ORDER — METOCLOPRAMIDE HCL 10 MG PO TABS: 10.0000 mg | ORAL_TABLET | Freq: Four times a day (QID) | ORAL | 0 refills | Status: AC | PRN

## 2017-06-28 MED ORDER — PROMETHAZINE HCL 25 MG RE SUPP: 25.0000 mg | Freq: Four times a day (QID) | RECTAL | 0 refills | Status: AC | PRN

## 2017-06-28 MED ORDER — SODIUM CHLORIDE 0.9 % IV BOLUS (SEPSIS)
1000.0000 mL | Freq: Once | INTRAVENOUS | Status: AC
  Administered 2017-06-28: 1000 mL via INTRAVENOUS

## 2017-06-28 MED ORDER — METOCLOPRAMIDE HCL 5 MG/ML IJ SOLN
10.0000 mg | Freq: Once | INTRAMUSCULAR | Status: AC
  Administered 2017-06-28: 10 mg via INTRAVENOUS
  Filled 2017-06-28: qty 2

## 2017-06-28 NOTE — ED Notes (Signed)
Pt vomiting during discharge. Updated EDP.

## 2017-06-28 NOTE — ED Triage Notes (Signed)
Pt c/o vomiting since last night at 2000, pt states she has more that 6 episodes of vomiting. No pain, no fever.

## 2017-06-28 NOTE — ED Provider Notes (Signed)
Patient reports symptoms are resolved.  She has not had any further vomiting.  She denies any abdominal pain.  She had reported multiple episodes of vomiting overnight particularly if trying to eat or drink.  Patient was given Reglan with good result.  Patient reports she has a Zofran allergy.  At this time will suggest Reglan and Benadryl until symptoms are controlled and then decrease dosing frequency.  Also patient is instructed to use Pepcid twice daily for 1 week.   Arby BarrettePfeiffer, Scarleth Brame, MD 06/28/17 [phone]

## 2017-06-28 NOTE — ED Notes (Signed)
Patient asked to obtain urine sample. Patient states she might be able to but she hasn't had anything to drink. Advised patient where restroom was. Patient closed her eyes and covered up with sheet.

## 2017-06-28 NOTE — Discharge Instructions (Signed)
1.  Schedule appointment with your family doctor for recheck this week.  If you do not have a family doctor use the referral number in your discharge instructions to schedule one. 2.  Take Reglan and Benadryl every 6 hours if needed for control of nausea and vomiting.  Take Pepcid twice daily for the next 5-7 days.  Decrease your doses of Reglan and Benadryl as soon as nausea and vomiting are improved. 3.  Keep fluids at your bedside and drink small sips throughout the course of the day.

## 2017-06-28 NOTE — ED Notes (Signed)
Ass[place] care of patient from [place], [place]. PT resting quietly. NO distress. NO complaints. Pt previously pulled IV out. No further vomiting.

## 2017-06-28 NOTE — ED Provider Notes (Signed)
[facility] Provider Note: [name], MD, FACEP  CSN: [id number] MRN: [id number] ARRIVAL: 06/28/17 at 0558 ROOM: MH10/MH10   CHIEF COMPLAINT  Vomiting   HISTORY OF PRESENT ILLNESS  06/28/17 6:13 AM Annette Bennett is a 22 y.o. female states she has been vomiting since 8 PM yesterday evening.  She estimates she has vomited "100 times".  She has had one soft bowel movement but no diarrhea.  She denies abdominal pain or abdominal cramping.  She denies fever but has had chills.  She denies lightheadedness or dry mouth.  Vomiting is stimulated when she attempts to drink something.   History reviewed. No pertinent past medical history.  History reviewed. No pertinent surgical history.  History reviewed. No pertinent family history.  Social History   Tobacco Use  . Smoking status: Former Smoker    Types: Cigars  . Smokeless tobacco: Never Used  Substance Use Topics  . Alcohol use: Yes    Comment: occ  . Drug use: Yes    Types: Marijuana    Prior to Admission medications   Medication Sig Start Date End Date Taking? Authorizing Provider  diphenhydrAMINE (BENADRYL) 25 MG tablet Take 1 tablet (25 mg total) by mouth every 6 (six) hours as needed. Take With Reglan for nausea and vomiting. 06/28/17   Arby BarrettePfeiffer, Marcy, MD  famotidine (PEPCID) 20 MG tablet Take 1 tablet (20 mg total) by mouth 2 (two) times daily. 06/28/17   Arby BarrettePfeiffer, Marcy, MD  hydrOXYzine (ATARAX/VISTARIL) 25 MG tablet Take 1 tablet (25 mg total) by mouth every 6 (six) hours as needed for itching (may cause drowsiness). 11/22/14   Briseida Gittings, MD  metoCLOPramide (REGLAN) 10 MG tablet Take 1 tablet (10 mg total) by mouth every 6 (six) hours. 07/06/15   Garlon HatchetSanders, Lisa M, PA-C  metoCLOPramide (REGLAN) 10 MG tablet Take 1 tablet (10 mg total) by mouth every 6 (six) hours as needed for nausea (nausea/headache). 06/28/17   Arby BarrettePfeiffer, Marcy, MD  ondansetron (ZOFRAN) 4 MG tablet Take 1 tablet (4 mg total) by mouth  every 8 (eight) hours as needed for nausea or vomiting. 02/02/15   Blake DivineWofford, Mychael Smock, MD  promethazine (PHENERGAN) 25 MG suppository Place 1 suppository (25 mg total) rectally every 6 (six) hours as needed for nausea or vomiting. 06/28/17   Arby BarrettePfeiffer, Marcy, MD    Allergies Bactrim [sulfamethoxazole-trimethoprim]   REVIEW OF SYSTEMS  Negative except as noted here or in the History of Present Illness.   PHYSICAL EXAMINATION  Initial Vital Signs Blood pressure (!) 175/84, pulse 68, temperature 98 F (36.7 C), temperature source Oral, resp. rate 18, height 5\' 3"  (1.6 m), weight 56.7 kg (125 lb), last menstrual period 06/02/2017, SpO2 99 %.  Examination General: Well-developed, well-nourished female in no acute distress; appearance consistent with age of record HENT: normocephalic; atraumatic; mucous membranes moist Eyes: pupils equal, round and reactive to light; extraocular muscles intact Neck: supple Heart: regular rate and rhythm Lungs: clear to auscultation bilaterally Abdomen: soft; nondistended; nontender; no masses or hepatosplenomegaly; bowel sounds present Extremities: No deformity; full range of motion; pulses normal Neurologic: Awake, alert and oriented; motor function intact in all extremities and symmetric; no facial droop Skin: Warm and dry Psychiatric: Flat affect   RESULTS  Summary of this visit's results, reviewed by myself:   EKG Interpretation  Date/Time:    Ventricular Rate:    PR Interval:    QRS Duration:   QT Interval:    QTC Calculation:   R Axis:  Text Interpretation:        Laboratory Studies: No results found for this or any previous visit (from the past 24 hour(s)). Imaging Studies: No results found.  ED COURSE  Nursing notes and initial vitals signs, including pulse oximetry, reviewed.  Vitals:   06/28/17 0605 06/28/17 0606 06/28/17 0847 06/28/17 0851  BP: (!) 175/84  (!) 139/94   Pulse: 68   75  Resp: 18     Temp: 98 F (36.7 C)       TempSrc: Oral     SpO2: 99%   98%  Weight:  56.7 kg (125 lb)    Height:  5\' 3"  (1.6 m)     7:02 AM 1 L normal saline bolus and Reglan 10 mg IV ordered. Dr. Donnald Garre will follow up and make disposition.  PROCEDURES    ED DIAGNOSES     ICD-10-CM   1. Nausea and vomiting, intractability of vomiting not specified, unspecified vomiting type R11.2        Paula Libra, MD 07/02/17 2234

## 2018-10-07 ENCOUNTER — Other Ambulatory Visit: Payer: Self-pay

## 2018-10-07 ENCOUNTER — Encounter (HOSPITAL_BASED_OUTPATIENT_CLINIC_OR_DEPARTMENT_OTHER): Payer: Self-pay

## 2018-10-07 ENCOUNTER — Emergency Department (HOSPITAL_BASED_OUTPATIENT_CLINIC_OR_DEPARTMENT_OTHER)
Admission: EM | Admit: 2018-10-07 | Discharge: 2018-10-07 | Disposition: A | Discharge status: Home / Self Care | Payer: Medicaid Other | Attending: Emergency Medicine | Admitting: Emergency Medicine

## 2018-10-07 DIAGNOSIS — B9689 Other specified bacterial agents as the cause of diseases classified elsewhere: Secondary | ICD-10-CM | POA: Diagnosis not present

## 2018-10-07 DIAGNOSIS — R1032 Left lower quadrant pain: Secondary | ICD-10-CM | POA: Diagnosis present

## 2018-10-07 DIAGNOSIS — R1030 Lower abdominal pain, unspecified: Secondary | ICD-10-CM

## 2018-10-07 DIAGNOSIS — Z87891 Personal history of nicotine dependence: Secondary | ICD-10-CM | POA: Diagnosis not present

## 2018-10-07 DIAGNOSIS — N760 Acute vaginitis: Secondary | ICD-10-CM | POA: Insufficient documentation

## 2018-10-07 LAB — URINALYSIS, ROUTINE W REFLEX MICROSCOPIC
Glucose, UA: NEGATIVE mg/dL
Ketones, ur: NEGATIVE mg/dL
Nitrite: NEGATIVE
Protein, ur: NEGATIVE mg/dL
Specific Gravity, Urine: 1.015 (ref 1.005–1.030)
pH: 7.5 (ref 5.0–8.0)

## 2018-10-07 LAB — COMPREHENSIVE METABOLIC PANEL
ALT: 11 U/L (ref 0–44)
AST: 13 U/L — ABNORMAL LOW (ref 15–41)
Albumin: 4 g/dL (ref 3.5–5.0)
Alkaline Phosphatase: 34 U/L — ABNORMAL LOW (ref 38–126)
Anion gap: 9 (ref 5–15)
BUN: 11 mg/dL (ref 6–20)
CO2: 26 mmol/L (ref 22–32)
Calcium: 8.9 mg/dL (ref 8.9–10.3)
Chloride: 100 mmol/L (ref 98–111)
Creatinine, Ser: 0.68 mg/dL (ref 0.44–1.00)
GFR calc Af Amer: >60 mL/min (ref >60)
GFR calc non Af Amer: >60 mL/min (ref >60)
Glucose, Bld: 87 mg/dL (ref 70–99)
Potassium: 3.3 mmol/L — ABNORMAL LOW (ref 3.5–5.1)
Sodium: 135 mmol/L (ref 135–145)
Total Bilirubin: 0.7 mg/dL (ref 0.3–1.2)
Total Protein: 7.6 g/dL (ref 6.5–8.1)

## 2018-10-07 LAB — CBC
HCT: 34.6 % — ABNORMAL LOW (ref 36.0–46.0)
Hemoglobin: 10.9 g/dL — ABNORMAL LOW (ref 12.0–15.0)
MCH: 29.5 pg (ref 26.0–34.0)
MCHC: 31.5 g/dL (ref 30.0–36.0)
MCV: 93.8 fL (ref 80.0–100.0)
Platelets: 241 10*3/uL (ref 150–400)
RBC: 3.69 MIL/uL — ABNORMAL LOW (ref 3.87–5.11)
RDW: 15.1 % (ref 11.5–15.5)
WBC: 10.1 10*3/uL (ref 4.0–10.5)
nRBC: 0 % (ref 0.0–0.2)

## 2018-10-07 LAB — WET PREP, GENITAL
Sperm: NONE SEEN
Trich, Wet Prep: NONE SEEN
Yeast Wet Prep HPF POC: NONE SEEN

## 2018-10-07 LAB — URINALYSIS, MICROSCOPIC (REFLEX)

## 2018-10-07 LAB — PREGNANCY, URINE: Preg Test, Ur: NEGATIVE

## 2018-10-07 MED ORDER — METRONIDAZOLE 500 MG PO TABS: 500.0000 mg | ORAL_TABLET | Freq: Two times a day (BID) | ORAL | 0 refills | Status: AC

## 2018-10-07 MED FILL — METRONIDAZOLE 500 MG TABS: 500 | 7 days supply | Qty: 14 | Fill #0

## 2018-10-07 NOTE — ED Triage Notes (Signed)
abd cramps and  n/v x 1 week-NAD-steady gait

## 2018-10-07 NOTE — Discharge Instructions (Addendum)
You were evaluated in the Emergency Department and after careful evaluation, we did not find any emergent condition requiring admission or further testing in the hospital.  Your symptoms today seem to be due to bacterial vaginosis.  Please take the medication provided as directed and follow-up with your regular doctor.  Please return to the Emergency Department if you experience any worsening of your condition.  We encourage you to follow up with a primary care provider.  Thank you for allowing [place] to be a part of your care.

## 2018-10-07 NOTE — ED Provider Notes (Signed)
[facility] Emergency Department Provider Note MRN:  [id number]  Arrival date & time: 10/07/18     Chief Complaint   Abdominal Pain   History of Present Illness   Annette Bennett is a 23 y.o. year-old female with no pertinent past medical history presenting to the ED with chief complaint of abdominal pain.  1 week of lower abdominal pain, worst in the left lower quadrant, constant, moderate in severity, no exacerbating relieving factors.  Denies vaginal bleeding, endorsing vaginal discharge but this is common for her.  Denies fever, no chest pain or shortness of breath, recent constipation.  Review of Systems  A complete 10 system review of systems was obtained and all systems are negative except as noted in the HPI and PMH.   Patient's Health History   History reviewed. No pertinent past medical history.  History reviewed. No pertinent surgical history.  No family history on file.  Social History   Socioeconomic History  . Marital status: Single    Spouse name: Not on file  . Number of children: Not on file  . Years of education: Not on file  . Highest education level: Not on file  Occupational History  . Not on file  Social Needs  . Financial resource strain: Not on file  . Food insecurity    Worry: Not on file    Inability: Not on file  . Transportation needs    Medical: Not on file    Non-medical: Not on file  Tobacco Use  . Smoking status: Former Smoker    Types: Cigars  . Smokeless tobacco: Never Used  Substance and Sexual Activity  . Alcohol use: Yes    Comment: occ  . Drug use: Not Currently    Types: Marijuana  . Sexual activity: Yes    Birth control/protection: None  Lifestyle  . Physical activity    Days per week: Not on file    Minutes per session: Not on file  . Stress: Not on file  Relationships  . Social Musicianconnections    Talks on phone: Not on file    Gets together: Not on file    Attends religious service: Not on  file    Active member of club or organization: Not on file    Attends meetings of clubs or organizations: Not on file    Relationship status: Not on file  . Intimate partner violence    Fear of current or ex partner: Not on file    Emotionally abused: Not on file    Physically abused: Not on file    Forced sexual activity: Not on file  Other Topics Concern  . Not on file  Social History Narrative  . Not on file     Physical Exam  Vital Signs and Nursing Notes reviewed Vitals:   10/07/18 1139  BP: 115/72  Pulse: 95  Resp: 18  Temp: 98.8 F (37.1 C)  SpO2: 98%    CONSTITUTIONAL: Well-appearing, NAD NEURO:  Alert and oriented x 3, no focal deficits EYES:  eyes equal and reactive ENT/NECK:  no LAD, no JVD CARDIO: Regular rate, well-perfused, normal S1 and S2 PULM:  CTAB no wheezing or rhonchi GI/GU:  normal bowel sounds, non-distended, mild left lower quadrant tenderness palpation MSK/SPINE:  No gross deformities, no edema SKIN:  no rash, atraumatic PSYCH:  Appropriate speech and behavior  Diagnostic and Interventional Summary    Labs Reviewed  WET PREP, GENITAL - Abnormal; Notable for the following  components:      Result Value   Clue Cells Wet Prep HPF POC PRESENT (*)    WBC, Wet Prep HPF POC MANY (*)    All other components within normal limits  URINALYSIS, ROUTINE W REFLEX MICROSCOPIC - Abnormal; Notable for the following components:   APPearance HAZY (*)    Hgb urine dipstick MODERATE (*)    Bilirubin Urine SMALL (*)    Leukocytes,Ua SMALL (*)    All other components within normal limits  CBC - Abnormal; Notable for the following components:   RBC 3.69 (*)    Hemoglobin 10.9 (*)    HCT 34.6 (*)    All other components within normal limits  COMPREHENSIVE METABOLIC PANEL - Abnormal; Notable for the following components:   Potassium 3.3 (*)    AST 13 (*)    Alkaline Phosphatase 34 (*)    All other components within normal limits  URINALYSIS, MICROSCOPIC  (REFLEX) - Abnormal; Notable for the following components:   Bacteria, UA MANY (*)    All other components within normal limits  PREGNANCY, URINE  GC/CHLAMYDIA PROBE AMP ([place]) NOT AT [facility]    No orders to display    Medications - No data to display   Procedures Critical Care  ED Course and Medical Decision Making  I have reviewed the triage vital signs and the nursing notes.  Pertinent labs & imaging results that were available during my care of the patient were reviewed by me and considered in my medical decision making (see below for details).  Lower abdominal pain with reported nausea vomiting for 1 week and this well-appearing healthy 23 year old female with normal vital signs.  No McBurney's point tenderness, low concern for appendicitis.  Pelvic exam pending.  Clinical Course as of Oct 06 1404  Wed Oct 07, 2018  1341 Pelvic exam: No adnexal tenderness or masses, no cervical motion tenderness, normal external genitalia with no lymphadenopathy, small amount of bloody/purulent discharge in the vault, swabbed.  Exam chaperoned by female ED tech.   [MB]    Clinical Course User Index [MB] Maudie Flakes, MD    Swabs reveal positive clue cells, with other symptoms is consistent with BV.  Provided with prescription for metronidazole, advised to not mix this medication with alcohol.  After the discussed management above, the patient was determined to be safe for discharge.  The patient was in agreement with this plan and all questions regarding their care were answered.  ED return precautions were discussed and the patient will return to the ED with any significant worsening of condition.  Barth Kirks. Sedonia Small, MD [place] [email or web address]  Final Clinical Impressions(s) / ED Diagnoses     ICD-10-CM   1. Lower abdominal pain  R10.30   2. BV (bacterial vaginosis)  N76.0    B96.89     ED Discharge Orders         Ordered     metroNIDAZOLE (FLAGYL) 500 MG tablet  2 times daily     10/07/18 1405             Maudie Flakes, MD 10/07/18 1406

## 2018-10-08 LAB — GC/CHLAMYDIA PROBE AMP (~~LOC~~) NOT AT ARMC
Chlamydia: NEGATIVE
Neisseria Gonorrhea: POSITIVE — AB

## 2019-12-15 ENCOUNTER — Encounter (HOSPITAL_BASED_OUTPATIENT_CLINIC_OR_DEPARTMENT_OTHER): Payer: Self-pay | Admitting: Emergency Medicine

## 2019-12-15 ENCOUNTER — Other Ambulatory Visit: Payer: Self-pay

## 2019-12-15 ENCOUNTER — Emergency Department (HOSPITAL_BASED_OUTPATIENT_CLINIC_OR_DEPARTMENT_OTHER)
Admission: EM | Admit: 2019-12-15 | Discharge: 2019-12-15 | Disposition: A | Discharge status: Home / Self Care | Payer: Medicaid Other | Attending: Emergency Medicine | Admitting: Emergency Medicine

## 2019-12-15 DIAGNOSIS — R112 Nausea with vomiting, unspecified: Secondary | ICD-10-CM | POA: Diagnosis present

## 2019-12-15 DIAGNOSIS — Z20822 Contact with and (suspected) exposure to covid-19: Secondary | ICD-10-CM | POA: Diagnosis not present

## 2019-12-15 DIAGNOSIS — Z87891 Personal history of nicotine dependence: Secondary | ICD-10-CM | POA: Insufficient documentation

## 2019-12-15 LAB — COMPREHENSIVE METABOLIC PANEL
ALT: 18 U/L (ref 0–44)
AST: 20 U/L (ref 15–41)
Albumin: 4.6 g/dL (ref 3.5–5.0)
Alkaline Phosphatase: 37 U/L — ABNORMAL LOW (ref 38–126)
Anion gap: 11 (ref 5–15)
BUN: 14 mg/dL (ref 6–20)
CO2: 22 mmol/L (ref 22–32)
Calcium: 9.5 mg/dL (ref 8.9–10.3)
Chloride: 103 mmol/L (ref 98–111)
Creatinine, Ser: 0.55 mg/dL (ref 0.44–1.00)
GFR calc Af Amer: >60 mL/min (ref >60)
GFR calc non Af Amer: >60 mL/min (ref >60)
Glucose, Bld: 104 mg/dL — ABNORMAL HIGH (ref 70–99)
Potassium: 3.7 mmol/L (ref 3.5–5.1)
Sodium: 136 mmol/L (ref 135–145)
Total Bilirubin: 0.6 mg/dL (ref 0.3–1.2)
Total Protein: 8.2 g/dL — ABNORMAL HIGH (ref 6.5–8.1)

## 2019-12-15 LAB — CBC WITH DIFFERENTIAL/PLATELET
Abs Immature Granulocytes: 0.01 10*3/uL (ref 0.00–0.07)
Basophils Absolute: 0 10*3/uL (ref 0.0–0.1)
Basophils Relative: 0 %
Eosinophils Absolute: 0 10*3/uL (ref 0.0–0.5)
Eosinophils Relative: 0 %
HCT: 34.7 % — ABNORMAL LOW (ref 36.0–46.0)
Hemoglobin: 11.3 g/dL — ABNORMAL LOW (ref 12.0–15.0)
Immature Granulocytes: 0 %
Lymphocytes Relative: 17 %
Lymphs Abs: 0.9 10*3/uL (ref 0.7–4.0)
MCH: 28.9 pg (ref 26.0–34.0)
MCHC: 32.6 g/dL (ref 30.0–36.0)
MCV: 88.7 fL (ref 80.0–100.0)
Monocytes Absolute: 0.2 10*3/uL (ref 0.1–1.0)
Monocytes Relative: 4 %
Neutro Abs: 4.3 10*3/uL (ref 1.7–7.7)
Neutrophils Relative %: 79 %
Platelets: 230 10*3/uL (ref 150–400)
RBC: 3.91 MIL/uL (ref 3.87–5.11)
RDW: 14.3 % (ref 11.5–15.5)
WBC: 5.4 10*3/uL (ref 4.0–10.5)
nRBC: 0 % (ref 0.0–0.2)

## 2019-12-15 LAB — LIPASE, BLOOD: Lipase: 23 U/L (ref 11–51)

## 2019-12-15 LAB — SARS CORONAVIRUS 2 BY RT PCR (HOSPITAL ORDER, PERFORMED IN ~~LOC~~ HOSPITAL LAB): SARS Coronavirus 2: NEGATIVE

## 2019-12-15 MED ORDER — SODIUM CHLORIDE 0.9 % IV BOLUS
1000.0000 mL | Freq: Once | INTRAVENOUS | Status: AC
  Administered 2019-12-15: 1000 mL via INTRAVENOUS

## 2019-12-15 MED ORDER — ONDANSETRON HCL 4 MG/2ML IJ SOLN
4.0000 mg | Freq: Once | INTRAMUSCULAR | Status: AC
  Administered 2019-12-15: 4 mg via INTRAVENOUS
  Filled 2019-12-15: qty 2

## 2019-12-15 MED ORDER — ONDANSETRON 4 MG PO TBDP
4.0000 mg | ORAL_TABLET | Freq: Once | ORAL | Status: AC
  Administered 2019-12-15: 4 mg via ORAL
  Filled 2019-12-15: qty 1

## 2019-12-15 NOTE — ED Provider Notes (Signed)
[facility] EMERGENCY DEPARTMENT Provider Note   CSN: [id number] Arrival date & time: 12/15/19  0024     History Chief Complaint  Patient presents with  . Emesis    Annette Bennett is a 24 y.o. female with a hx of no major medical problems presents to the Emergency Department complaining of gradual, persistent, progressively worsening nausea and vomiting onset 14 hours prior to arrival. Patient reports emesis has been nonbloody and nonbilious. She reports she has vomited more times than she can count. She reports 1 episode of loose stool but no ongoing diarrhea. She is not vaccinated for Covid. No known sick contacts. No specific aggravating or alleviating factors. Patient reports her abdomen feels sore and crampy but no focal abdominal pain. She denies fever, chills, headache, neck pain, neck stiffness, chest pain, shortness of breath, weakness, dizziness, syncope, dysuria, hematuria.  The history is provided by the patient and medical records. No language interpreter was used.       History reviewed. No pertinent past medical history.  There are no problems to display for this patient.   History reviewed. No pertinent surgical history.   OB History   No obstetric history on file.     No family history on file.  Social History   Tobacco Use  . Smoking status: Former Smoker    Types: Cigars  . Smokeless tobacco: Never Used  Vaping Use  . Vaping Use: Never used  Substance Use Topics  . Alcohol use: Yes    Comment: occ  . Drug use: Not Currently    Types: Marijuana    Home Medications Prior to Admission medications   Medication Sig Start Date End Date Taking? Authorizing Provider  diphenhydrAMINE (BENADRYL) 25 MG tablet Take 1 tablet (25 mg total) by mouth every 6 (six) hours as needed. Take With Reglan for nausea and vomiting. 06/28/17   Arby Barrette, MD  famotidine (PEPCID) 20 MG tablet Take 1 tablet (20 mg total) by mouth 2 (two) times daily. 06/28/17    Arby Barrette, MD  hydrOXYzine (ATARAX/VISTARIL) 25 MG tablet Take 1 tablet (25 mg total) by mouth every 6 (six) hours as needed for itching (may cause drowsiness). 11/22/14   Molpus, John, MD  metoCLOPramide (REGLAN) 10 MG tablet Take 1 tablet (10 mg total) by mouth every 6 (six) hours. 07/06/15   Garlon Hatchet, PA-C  metoCLOPramide (REGLAN) 10 MG tablet Take 1 tablet (10 mg total) by mouth every 6 (six) hours as needed for nausea (nausea/headache). 06/28/17   Arby Barrette, MD  ondansetron (ZOFRAN) 4 MG tablet Take 1 tablet (4 mg total) by mouth every 8 (eight) hours as needed for nausea or vomiting. 02/02/15   Blake Divine, MD  promethazine (PHENERGAN) 25 MG suppository Place 1 suppository (25 mg total) rectally every 6 (six) hours as needed for nausea or vomiting. 06/28/17   Arby Barrette, MD    Allergies    Bactrim [sulfamethoxazole-trimethoprim]  Review of Systems   Review of Systems  Constitutional: Negative for appetite change, diaphoresis, fatigue, fever and unexpected weight change.  HENT: Negative for mouth sores.   Eyes: Negative for visual disturbance.  Respiratory: Negative for cough, chest tightness, shortness of breath and wheezing.   Cardiovascular: Negative for chest pain.  Gastrointestinal: Positive for nausea and vomiting. Negative for abdominal pain, constipation and diarrhea.  Endocrine: Negative for polydipsia, polyphagia and polyuria.  Genitourinary: Negative for dysuria, frequency, hematuria and urgency.  Musculoskeletal: Negative for back pain and neck stiffness.  Skin:  Negative for rash.  Allergic/Immunologic: Negative for immunocompromised state.  Neurological: Negative for syncope, light-headedness and headaches.  Hematological: Does not bruise/bleed easily.  Psychiatric/Behavioral: Negative for sleep disturbance. The patient is not nervous/anxious.     Physical Exam Updated Vital Signs BP 108/64   Pulse 67   Temp 98.2 F (36.8 C) (Oral)   Resp 16    Ht 5\' 4"  (1.626 m)   Wt 54.4 kg   SpO2 99%   BMI 20.60 kg/m   Physical Exam Vitals and nursing note reviewed.  Constitutional:      General: She is not in acute distress.    Appearance: She is not diaphoretic.  HENT:     Head: Normocephalic.     Mouth/Throat:     Mouth: Mucous membranes are moist.  Eyes:     General: No scleral icterus.    Conjunctiva/sclera: Conjunctivae normal.  Cardiovascular:     Rate and Rhythm: Normal rate and regular rhythm.     Pulses: Normal pulses.          Radial pulses are 2+ on the right side and 2+ on the left side.  Pulmonary:     Effort: No tachypnea, accessory muscle usage, prolonged expiration, respiratory distress or retractions.     Breath sounds: No stridor.     Comments: Equal chest rise. No increased work of breathing. Abdominal:     General: There is no distension.     Palpations: Abdomen is soft.     Tenderness: There is no abdominal tenderness. There is no right CVA tenderness, left CVA tenderness, guarding or rebound.  Musculoskeletal:     Cervical back: Normal range of motion.     Comments: Moves all extremities equally and without difficulty.  Skin:    General: Skin is warm and dry.     Capillary Refill: Capillary refill takes less than 2 seconds.  Neurological:     Mental Status: She is alert.     GCS: GCS eye subscore is 4. GCS verbal subscore is 5. GCS motor subscore is 6.     Comments: Speech is clear and goal oriented.  Psychiatric:        Mood and Affect: Mood normal.     ED Results / Procedures / Treatments   Labs (all labs ordered are listed, but only abnormal results are displayed) Labs Reviewed  CBC WITH DIFFERENTIAL/PLATELET - Abnormal; Notable for the following components:      Result Value   Hemoglobin 11.3 (*)    HCT 34.7 (*)    All other components within normal limits  COMPREHENSIVE METABOLIC PANEL - Abnormal; Notable for the following components:   Glucose, Bld 104 (*)    Total Protein 8.2 (*)      Alkaline Phosphatase 37 (*)    All other components within normal limits  SARS CORONAVIRUS 2 BY RT PCR (HOSPITAL ORDER, PERFORMED IN [facility])  LIPASE, BLOOD  URINALYSIS, ROUTINE W REFLEX MICROSCOPIC  PREGNANCY, URINE    Radiology No results found.  Procedures Procedures (including critical care time)  Medications Ordered in ED Medications  ondansetron (ZOFRAN-ODT) disintegrating tablet 4 mg (4 mg Oral Given 12/15/19 0040)  sodium chloride 0.9 % bolus 1,000 mL (1,000 mLs Intravenous New Bag/Given 12/15/19 0519)  ondansetron (ZOFRAN) injection 4 mg (4 mg Intravenous Given 12/15/19 02/14/20)    ED Course  I have reviewed the triage vital signs and the nursing notes.  Pertinent labs & imaging results that were available during my  care of the patient were reviewed by me and considered in my medical decision making (see chart for details).  Clinical Course as of Dec 15 626  Wed Dec 15, 2019  0600 Baseline  Hemoglobin(!): 11.3 [HM]  0600 No leukocytosis  WBC: 5.4 [HM]  0601 Electrolytes reassuring  Sodium: 136 [HM]  0601 No AKI  Creatinine: 0.55 [HM]    Clinical Course User Index [HM] Cylis Ayars, Boyd Kerbs   MDM Rules/Calculators/A&P                           Patient presents with nausea and vomiting. Physical exam reassuring. Abdomen soft and nontender on exam. Moist mucous membranes. Will give fluids, check labs and reassess.  6:28 AM Patient reports she is feeling some better.  On repeat exam abdomen remains soft and nontender.  Patient labs are reassuring.  Mild anemia appears to be baseline.  No evidence of sirs or sepsis.  Awaiting UA, pregnancy test and Covid test.  At shift change care was transferred to oncoming care team who will follow pending studies, re-evaulate and determine disposition.     Final Clinical Impression(s) / ED Diagnoses Final diagnoses:  Non-intractable vomiting with nausea, unspecified vomiting type    Rx / DC Orders ED  Discharge Orders    None       Mardene Sayer Boyd Kerbs 12/15/19 7616    Shon Baton, MD 12/15/19 [phone]

## 2019-12-15 NOTE — ED Notes (Signed)
Pt has refused to give urine specimen during visit.

## 2019-12-15 NOTE — ED Triage Notes (Signed)
Pt with n/v/d x 14 hours

## 2019-12-15 NOTE — ED Notes (Signed)
Pt given ginger ale.

## 2019-12-15 NOTE — ED Provider Notes (Signed)
Signout from Dr. Wilkie Aye.  24 year old female here with nausea and vomiting.  She is pending a UA and UCG.  Anticipated discharge. Physical Exam  BP 108/64   Pulse 67   Temp 98.2 F (36.8 C) (Oral)   Resp 16   Ht 5\' 4"  (1.626 m)   Wt 54.4 kg   SpO2 99%   BMI 20.60 kg/m   Physical Exam  ED Course/Procedures   Clinical Course as of Dec 14 737  Wed Dec 15, 2019  0600 Baseline  Hemoglobin(!): 11.3 [HM]  0600 No leukocytosis  WBC: 5.4 [HM]  0601 Electrolytes reassuring  Sodium: 136 [HM]  0601 No AKI  Creatinine: 0.55 [HM]    Clinical Course User Index [HM] [place]baugh, 0602, PA-C    Procedures  MDM  I was informed by the nurse that the patient does not want to stick around and give a urine.  She is asking to be discharged.  Discharge papers printed out.      Dahlia Client, MD 12/15/19 [phone]

## 2022-01-08 ENCOUNTER — Encounter (HOSPITAL_BASED_OUTPATIENT_CLINIC_OR_DEPARTMENT_OTHER): Payer: Self-pay | Admitting: Pediatrics

## 2022-01-08 ENCOUNTER — Emergency Department (HOSPITAL_BASED_OUTPATIENT_CLINIC_OR_DEPARTMENT_OTHER)
Admission: EM | Admit: 2022-01-08 | Discharge: 2022-01-08 | Disposition: A | Discharge status: Home / Self Care | Payer: Medicaid Other | Attending: Emergency Medicine | Admitting: Emergency Medicine

## 2022-01-08 ENCOUNTER — Emergency Department (HOSPITAL_BASED_OUTPATIENT_CLINIC_OR_DEPARTMENT_OTHER): Payer: Medicaid Other

## 2022-01-08 DIAGNOSIS — Z3A01 Less than 8 weeks gestation of pregnancy: Secondary | ICD-10-CM | POA: Insufficient documentation

## 2022-01-08 DIAGNOSIS — O209 Hemorrhage in early pregnancy, unspecified: Secondary | ICD-10-CM | POA: Diagnosis present

## 2022-01-08 DIAGNOSIS — O4690 Antepartum hemorrhage, unspecified, unspecified trimester: Secondary | ICD-10-CM

## 2022-01-08 LAB — CBC WITH DIFFERENTIAL/PLATELET
Abs Immature Granulocytes: 0.01 10*3/uL (ref 0.00–0.07)
Basophils Absolute: 0 10*3/uL (ref 0.0–0.1)
Basophils Relative: 0 %
Eosinophils Absolute: 0.1 10*3/uL (ref 0.0–0.5)
Eosinophils Relative: 2 %
HCT: 35.8 % — ABNORMAL LOW (ref 36.0–46.0)
Hemoglobin: 11.6 g/dL — ABNORMAL LOW (ref 12.0–15.0)
Immature Granulocytes: 0 %
Lymphocytes Relative: 32 %
Lymphs Abs: 1.7 10*3/uL (ref 0.7–4.0)
MCH: 30.5 pg (ref 26.0–34.0)
MCHC: 32.4 g/dL (ref 30.0–36.0)
MCV: 94.2 fL (ref 80.0–100.0)
Monocytes Absolute: 0.3 10*3/uL (ref 0.1–1.0)
Monocytes Relative: 6 %
Neutro Abs: 3.2 10*3/uL (ref 1.7–7.7)
Neutrophils Relative %: 60 %
Platelets: 328 10*3/uL (ref 150–400)
RBC: 3.8 MIL/uL — ABNORMAL LOW (ref 3.87–5.11)
RDW: 13.9 % (ref 11.5–15.5)
WBC: 5.3 10*3/uL (ref 4.0–10.5)
nRBC: 0 % (ref 0.0–0.2)

## 2022-01-08 LAB — WET PREP, GENITAL
Sperm: NONE SEEN
Trich, Wet Prep: NONE SEEN
WBC, Wet Prep HPF POC: <10 (ref <10)
Yeast Wet Prep HPF POC: NONE SEEN

## 2022-01-08 LAB — URINALYSIS, ROUTINE W REFLEX MICROSCOPIC
Bilirubin Urine: NEGATIVE
Glucose, UA: NEGATIVE mg/dL
Hgb urine dipstick: NEGATIVE
Ketones, ur: NEGATIVE mg/dL
Leukocytes,Ua: NEGATIVE
Nitrite: NEGATIVE
Protein, ur: NEGATIVE mg/dL
Specific Gravity, Urine: 1.02 (ref 1.005–1.030)
pH: 7.5 (ref 5.0–8.0)

## 2022-01-08 LAB — COMPREHENSIVE METABOLIC PANEL
ALT: 14 U/L (ref 0–44)
AST: 18 U/L (ref 15–41)
Albumin: 4 g/dL (ref 3.5–5.0)
Alkaline Phosphatase: 41 U/L (ref 38–126)
Anion gap: 8 (ref 5–15)
BUN: 9 mg/dL (ref 6–20)
CO2: 23 mmol/L (ref 22–32)
Calcium: 9 mg/dL (ref 8.9–10.3)
Chloride: 104 mmol/L (ref 98–111)
Creatinine, Ser: 0.67 mg/dL (ref 0.44–1.00)
GFR, Estimated: >60 mL/min (ref >60)
Glucose, Bld: 99 mg/dL (ref 70–99)
Potassium: 4 mmol/L (ref 3.5–5.1)
Sodium: 135 mmol/L (ref 135–145)
Total Bilirubin: 0.3 mg/dL (ref 0.3–1.2)
Total Protein: 7.9 g/dL (ref 6.5–8.1)

## 2022-01-08 LAB — HCG, QUANTITATIVE, PREGNANCY: hCG, Beta Chain, Quant, S: 4227 m[IU]/mL — ABNORMAL HIGH (ref <5)

## 2022-01-08 NOTE — ED Triage Notes (Signed)
C/O spotting on and off x 3 weeks; reported stopped Oral contraceptives back in April 2023, and haven't had a regular period since, and is sexually active.

## 2022-01-08 NOTE — Discharge Instructions (Signed)
Follow-up with OB/GYN.  Please call later today or tomorrow to schedule follow-up appointment.  Tell them that you likely need repeat imaging in 2 weeks and that you had a visit here today.  If you have any significant pain or major bleeding as discussed please follow-up at [facility] where we have an OB fast-track area.

## 2022-01-08 NOTE — ED Provider Notes (Signed)
[facility] EMERGENCY DEPARTMENT Provider Note   CSN: [id number] Arrival date & time: 01/08/22  1038     History  Chief Complaint  Patient presents with   Vaginal Bleeding    Annette Bennett is a 26 y.o. female.  Patient here with vaginal spotting, positive pregnancy test today.  Not having any abdominal pain or vaginal discharge or discomfort.  She recently stopped birth control couple months ago and has not had a normal cycle since.  She is not sure when her last menstrual cycle was.  She denies any nausea or vomiting or fever or chills.  The history is provided by the patient.       Home Medications Prior to Admission medications   Medication Sig Start Date End Date Taking? Authorizing Provider  diphenhydrAMINE (BENADRYL) 25 MG tablet Take 1 tablet (25 mg total) by mouth every 6 (six) hours as needed. Take With Reglan for nausea and vomiting. 06/28/17   Charlesetta Shanks, MD  famotidine (PEPCID) 20 MG tablet Take 1 tablet (20 mg total) by mouth 2 (two) times daily. 06/28/17   Charlesetta Shanks, MD  hydrOXYzine (ATARAX/VISTARIL) 25 MG tablet Take 1 tablet (25 mg total) by mouth every 6 (six) hours as needed for itching (may cause drowsiness). 11/22/14   Molpus, John, MD  metoCLOPramide (REGLAN) 10 MG tablet Take 1 tablet (10 mg total) by mouth every 6 (six) hours. 07/06/15   Larene Pickett, PA-C  metoCLOPramide (REGLAN) 10 MG tablet Take 1 tablet (10 mg total) by mouth every 6 (six) hours as needed for nausea (nausea/headache). 06/28/17   Charlesetta Shanks, MD  ondansetron (ZOFRAN) 4 MG tablet Take 1 tablet (4 mg total) by mouth every 8 (eight) hours as needed for nausea or vomiting. 02/02/15   Serita Grit, MD  promethazine (PHENERGAN) 25 MG suppository Place 1 suppository (25 mg total) rectally every 6 (six) hours as needed for nausea or vomiting. 06/28/17   Charlesetta Shanks, MD      Allergies    Bactrim [sulfamethoxazole-trimethoprim]    Review of Systems   Review of  Systems  Physical Exam Updated Vital Signs BP 112/73 (BP Location: Right Arm)   Pulse 90   Temp 98.7 F (37.1 C) (Oral)   Resp 17   Ht 5\' 4"  (1.626 m)   Wt 81.6 kg   SpO2 100%   BMI 30.90 kg/m  Physical Exam Vitals and nursing note reviewed. Exam conducted with a chaperone present.  Constitutional:      General: She is not in acute distress.    Appearance: She is well-developed.  HENT:     Head: Normocephalic and atraumatic.  Eyes:     Conjunctiva/sclera: Conjunctivae normal.  Cardiovascular:     Rate and Rhythm: Normal rate and regular rhythm.     Heart sounds: No murmur heard. Pulmonary:     Effort: Pulmonary effort is normal. No respiratory distress.     Breath sounds: Normal breath sounds.  Abdominal:     Palpations: Abdomen is soft.     Tenderness: There is no abdominal tenderness.  Genitourinary:    General: Normal vulva.     Vagina: No vaginal discharge.     Comments: No vaginal discharge, cervix is closed, no vaginal bleeding, no cervical motion tenderness or adnexal tenderness Musculoskeletal:        General: No swelling.     Cervical back: Neck supple.  Skin:    General: Skin is warm and dry.     Capillary Refill:  Capillary refill takes less than 2 seconds.  Neurological:     Mental Status: She is alert.  Psychiatric:        Mood and Affect: Mood normal.     ED Results / Procedures / Treatments   Labs (all labs ordered are listed, but only abnormal results are displayed) Labs Reviewed  WET PREP, GENITAL - Abnormal; Notable for the following components:      Result Value   Clue Cells Wet Prep HPF POC PRESENT (*)    All other components within normal limits  CBC WITH DIFFERENTIAL/PLATELET - Abnormal; Notable for the following components:   RBC 3.80 (*)    Hemoglobin 11.6 (*)    HCT 35.8 (*)    All other components within normal limits  HCG, QUANTITATIVE, PREGNANCY - Abnormal; Notable for the following components:   hCG, Beta Chain, Quant, S 4,227  (*)    All other components within normal limits  URINALYSIS, ROUTINE W REFLEX MICROSCOPIC  COMPREHENSIVE METABOLIC PANEL  GC/CHLAMYDIA PROBE AMP ([place]) NOT AT [facility]    EKG None  Radiology US OB LESS THAN 14 WEEKS WITH OB TRANSVAGINAL  Result Date: 01/08/2022 CLINICAL DATA:  Vaginal bleeding in first trimester of pregnancy, unknown LMP, quantitative beta HCG 4227 EXAM: OBSTETRIC <14 WK [place] AND TRANSVAGINAL OB US TECHNIQUE: Both transabdominal and transvaginal ultrasound examinations were performed for complete evaluation of the gestation as well as the maternal uterus, adnexal regions, and pelvic cul-de-sac. Transvaginal technique was performed to assess early pregnancy. COMPARISON:  None Available. FINDINGS: Intrauterine gestational sac: Present, single Yolk sac:  Not identified Embryo:  Not identified Cardiac Activity: N/A Heart Rate: N/A  bpm MSD: 4.6 mm   5 w   2 d CRL:    mm    w    d                  [place] EDC: Subchorionic hemorrhage:  None visualized. Maternal uterus/adnexae: Maternal uterus unremarkable. No discrete pelvic mass. Complex free pelvic fluid present with multiple septations, nonspecific. LEFT ovary normal size and morphology 2.4 x 4.1 x 2.2 cm. RIGHT ovary normal size and morphology 1.7 x 3.7 x 1.8 cm. IMPRESSION: Small gestational sac within the uterus, mean sac diameter corresponding to 5 weeks 2 days EGA. No yolk sac or fetal pole identified; consider follow-up ultrasound in 14 days to establish viability. Complex free pelvic fluid within cul-de-sac, containing multiple septations, nonspecific in appearance; recommend attention on follow-up ultrasound. Electronically Signed   By: Ulyses Southward M.D.   On: 01/08/2022 12:55    Procedures Procedures    Medications Ordered in ED Medications - No data to display  ED Course/ Medical Decision Making/ A&P                           Medical Decision Making Amount and/or Complexity of Data Reviewed Labs: ordered. Radiology:  ordered.   Annette Bennett is here for evaluation of vaginal bleeding and positive pregnancy test.  Normal vitals.  No fever.  Differential diagnosis is ectopic pregnancy versus miscarriage versus normal pregnancy.  No significant medical history.  GU exam is unremarkable.  Cervical os is closed.  There is no discharge or bleeding.  Urinalysis negative for infection.  Blood work with no significant anemia, electrolyte abnormality, kidney injury per my review and interpretation of labs.  hCG around 4200.  Ultrasound per radiology report shows small gestational sac within the uterus may  be around 5 weeks and 2 days.  No yolk sac or fetal pole is identified yet.  Possible this could be a miscarriage or developing pregnancy.  She is not having any abdominal pain or major bleeding.  We will have her follow-up with OB/GYN for repeat ultrasound.  She understands return precautions.  Discharged in good condition.  She is not having any discharge or symptoms we will hold off on any treatment for BV or STDs.  This chart was dictated using voice recognition software.  Despite best efforts to proofread,  errors can occur which can change the documentation meaning.         Final Clinical Impression(s) / ED Diagnoses Final diagnoses:  Vaginal bleeding in pregnancy    Rx / DC Orders ED Discharge Orders     None         Virgina Norfolk, DO 01/08/22 1352

## 2022-01-09 LAB — GC/CHLAMYDIA PROBE AMP (~~LOC~~) NOT AT ARMC
Chlamydia: NEGATIVE
Comment: NEGATIVE
Comment: NORMAL
Neisseria Gonorrhea: NEGATIVE

## 2022-01-14 ENCOUNTER — Other Ambulatory Visit (HOSPITAL_BASED_OUTPATIENT_CLINIC_OR_DEPARTMENT_OTHER): Payer: Self-pay

## 2022-01-14 ENCOUNTER — Other Ambulatory Visit: Payer: Self-pay

## 2022-01-14 ENCOUNTER — Emergency Department (HOSPITAL_BASED_OUTPATIENT_CLINIC_OR_DEPARTMENT_OTHER): Payer: Medicaid Other

## 2022-01-14 ENCOUNTER — Emergency Department (HOSPITAL_BASED_OUTPATIENT_CLINIC_OR_DEPARTMENT_OTHER)
Admission: EM | Admit: 2022-01-14 | Discharge: 2022-01-14 | Disposition: A | Discharge status: Home / Self Care | Payer: Medicaid Other | Attending: Emergency Medicine | Admitting: Emergency Medicine

## 2022-01-14 ENCOUNTER — Encounter (HOSPITAL_BASED_OUTPATIENT_CLINIC_OR_DEPARTMENT_OTHER): Payer: Self-pay | Admitting: Pediatrics

## 2022-01-14 DIAGNOSIS — Z3A00 Weeks of gestation of pregnancy not specified: Secondary | ICD-10-CM | POA: Insufficient documentation

## 2022-01-14 DIAGNOSIS — O039 Complete or unspecified spontaneous abortion without complication: Secondary | ICD-10-CM | POA: Insufficient documentation

## 2022-01-14 DIAGNOSIS — O209 Hemorrhage in early pregnancy, unspecified: Secondary | ICD-10-CM | POA: Diagnosis present

## 2022-01-14 LAB — HCG, QUANTITATIVE, PREGNANCY: hCG, Beta Chain, Quant, S: 3847 m[IU]/mL — ABNORMAL HIGH (ref <5)

## 2022-01-14 MED ORDER — NAPROXEN 375 MG PO TABS
375.0000 mg | ORAL_TABLET | Freq: Two times a day (BID) | ORAL | 0 refills | Status: AC
  Filled 2022-01-14: qty 20, 10d supply, fill #0

## 2022-01-14 MED ORDER — MISOPROSTOL 200 MCG PO TABS
800.0000 ug | ORAL_TABLET | Freq: Once | ORAL | Status: AC
  Administered 2022-01-14: 800 ug via ORAL
  Filled 2022-01-14: qty 4

## 2022-01-14 MED ORDER — MISOPROSTOL 200 MCG PO TABS
400.0000 ug | ORAL_TABLET | Freq: Three times a day (TID) | ORAL | 0 refills | Status: AC
  Filled 2022-01-14: qty 18, 3d supply, fill #0

## 2022-01-14 NOTE — Discharge Instructions (Signed)
Contact a health care provider if: You have a fever or chills. There is bad-smelling fluid coming from the vagina. You have more bleeding instead of less. Tissue or blood clots come out of your vagina. Get help right away if: You have severe cramps or pain in your back or abdomen. Heavy bleeding soaks through 2 large sanitary pads an hour for more than 2 hours. You become light-headed or weak. You faint. You feel sad, and your sadness takes over your thoughts. You think about hurting yourself.

## 2022-01-14 NOTE — ED Triage Notes (Signed)
C/O heavy vaginal bleeding, clots formation along with cramping. Stated "its like a cycle". Started last night, +pregnancy on last visit;

## 2022-01-14 NOTE — ED Provider Notes (Signed)
[place] EMERGENCY DEPARTMENT Provider Note   CSN: [id number] Arrival date & time: 01/14/22  1228     History  Chief Complaint  Patient presents with   Vaginal Bleeding in Pregnancy    Annette Bennett is a 26 y.o. female who presents to the emergency department with chief complaint of vaginal bleeding.  Patient states that last night she began having cramping and vaginal bleeding.  She is G2 P1-0-0-1.  She was seen 6 days ago and she did have a positive pregnancy test.  She had an ultrasound of the uterus that showed a single gestational sac in the uterus without fetal pole and concern for nonviability.  Patient denies any significant pain at this time.  HPI     Home Medications Prior to Admission medications   Medication Sig Start Date End Date Taking? Authorizing Provider  misoprostol (CYTOTEC) 200 MCG tablet Take 2 tablets (400 mcg total) by mouth 3 (three) times daily for 3 days. 01/14/22 01/17/22 Yes Roey Coopman, PA-C  naproxen (NAPROSYN) 375 MG tablet Take 1 tablet (375 mg total) by mouth 2 (two) times daily with a meal. 01/14/22  Yes Kristl Morioka, PA-C  diphenhydrAMINE (BENADRYL) 25 MG tablet Take 1 tablet (25 mg total) by mouth every 6 (six) hours as needed. Take With Reglan for nausea and vomiting. 06/28/17   Charlesetta Shanks, MD  famotidine (PEPCID) 20 MG tablet Take 1 tablet (20 mg total) by mouth 2 (two) times daily. 06/28/17   Charlesetta Shanks, MD  hydrOXYzine (ATARAX/VISTARIL) 25 MG tablet Take 1 tablet (25 mg total) by mouth every 6 (six) hours as needed for itching (may cause drowsiness). 11/22/14   Molpus, John, MD  metoCLOPramide (REGLAN) 10 MG tablet Take 1 tablet (10 mg total) by mouth every 6 (six) hours. 07/06/15   Larene Pickett, PA-C  metoCLOPramide (REGLAN) 10 MG tablet Take 1 tablet (10 mg total) by mouth every 6 (six) hours as needed for nausea (nausea/headache). 06/28/17   Charlesetta Shanks, MD  ondansetron (ZOFRAN) 4 MG tablet Take 1 tablet (4 mg  total) by mouth every 8 (eight) hours as needed for nausea or vomiting. 02/02/15   Serita Grit, MD  promethazine (PHENERGAN) 25 MG suppository Place 1 suppository (25 mg total) rectally every 6 (six) hours as needed for nausea or vomiting. 06/28/17   Charlesetta Shanks, MD      Allergies    Bactrim [sulfamethoxazole-trimethoprim]    Review of Systems   Review of Systems  Physical Exam Updated Vital Signs BP 113/88   Pulse 85   Temp 98.4 F (36.9 C) (Oral)   Resp 18   Ht 5\' 4"  (1.626 m)   Wt 81.6 kg   SpO2 100%   BMI 30.90 kg/m  Physical Exam Vitals and nursing note reviewed. Exam conducted with a chaperone present (Candy [place]).  Constitutional:      General: She is not in acute distress.    Appearance: She is well-developed. She is not diaphoretic.  HENT:     Head: Normocephalic and atraumatic.     Right Ear: External ear normal.     Left Ear: External ear normal.     Nose: Nose normal.     Mouth/Throat:     Mouth: Mucous membranes are moist.  Eyes:     General: No scleral icterus.    Conjunctiva/sclera: Conjunctivae normal.  Cardiovascular:     Rate and Rhythm: Normal rate and regular rhythm.     Heart sounds: Normal heart sounds. No  murmur heard.    No friction rub. No gallop.  Pulmonary:     Effort: Pulmonary effort is normal. No respiratory distress.     Breath sounds: Normal breath sounds.  Abdominal:     General: Bowel sounds are normal. There is no distension.     Palpations: Abdomen is soft. There is no mass.     Tenderness: There is no abdominal tenderness. There is no guarding.  Genitourinary:    Comments: I performed a speculum examination of the cervix.  She has no active bleeding from the cervical os and no obvious retained products within the cervical opening. Musculoskeletal:     Cervical back: Normal range of motion.  Skin:    General: Skin is warm and dry.  Neurological:     Mental Status: She is alert and oriented to person, place, and time.   Psychiatric:        Behavior: Behavior normal.     ED Results / Procedures / Treatments   Labs (all labs ordered are listed, but only abnormal results are displayed) Labs Reviewed  HCG, QUANTITATIVE, PREGNANCY - Abnormal; Notable for the following components:      Result Value   hCG, Beta Chain, Quant, S 3,847 (*)    All other components within normal limits    EKG None  Radiology US OB LESS THAN 14 WEEKS WITH OB TRANSVAGINAL  Result Date: 01/14/2022 CLINICAL DATA:  Vaginal bleeding EXAM: OBSTETRIC <14 WK [place] AND TRANSVAGINAL OB US TECHNIQUE: Both transabdominal and transvaginal ultrasound examinations were performed for complete evaluation of the gestation as well as the maternal uterus, adnexal regions, and pelvic cul-de-sac. Transvaginal technique was performed to assess early pregnancy. COMPARISON:  Pelvic ultrasound dated January 08, 2022 FINDINGS: Intrauterine gestational sac: None Yolk sac:  Not Visualized. Embryo:  Not Visualized. Cardiac Activity: Not Visualized. Subchorionic hemorrhage:  None visualized. Maternal uterus/adnexae: Normal bilateral ovaries. 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 IMPRESSION: 1. Likely pregnancy failure given regression of possible gestational sac seen on prior. Recommend correlation with beta HCG. 2. Pelvic septate cystic lesion is nonspecific and incompletely evaluated on this study. Suggest dedicated evaluation with TA/TV US in 4-6 weeks, and if persistant on that study could consider further evaluation with pelvic MRI w/wo contrast if bHCG returns to null. Electronically Signed   By: Allegra Lai M.D.   On: 01/14/2022 15:38    Procedures Procedures    Medications Ordered in ED Medications  misoprostol (CYTOTEC) tablet 800 mcg (800 mcg Oral Given 01/14/22 1621)    ED Course/ Medical Decision Making/ A&P Clinical Course as of 01/14/22 1632  Mon Jan 14, 2022  1451 Blood type AB pos as  seen in outside records [facility] 09/19/2016 [AH]  1621 HCG, Beta Chain, Quant, S(!): 3,847 hCG downtrending by about 1000 points from last week [AH]  1621 US OB LESS THAN 14 WEEKS WITH OB TRANSVAGINAL Visualized and interpreted ultrasound OB which shows no gestational sac which was in the uterus at last week's assessment [AH]  1621 Case discussed with Dr. Despina Hidden on-call for faculty practice OB/GYN.  We reviewed the patient's case including work-up and previous and new ultrasound.  He agrees that patient is currently having spontaneous abortion of likely nonviable pregnancy.  He recommends 800 grams of Cytotec by mouth today and 400 mcg of Cytotec twice daily x3 days [AH]    Clinical Course User Index [AH] Arthor Captain, PA-C  Medical Decision Making Patient here with bleeding in early pregnancy.  The differential diagnosis for vaginal bleeding in pregnancy less than 20 weeks includes but is not limited to the following: Ectopic pregnancy, Subchorionic hematoma, First Trimester Abortion, Gestational trophoblastic disease, Heterotopic pregnancy, Implantation bleeding, Molar pregnancy, Cervicitis, Fibroids, Vaginal Trauma  After review of all data points patient appears to be having spontaneous abortion of nonviable pregnancy.  Will discharge with Cytotec as recommended by obstetrics.  I have discussed and will give printed return precautions with the patient.  I answered all questions to the best of my ability.  Amount and/or Complexity of Data Reviewed Labs: ordered. Decision-making details documented in ED Course. Radiology: ordered and independent interpretation performed. Decision-making details documented in ED Course. Discussion of management or test interpretation with external provider(s): Dr. Despina Hidden- see ed course  Risk Prescription drug management.          Final Clinical Impression(s) / ED Diagnoses Final diagnoses:  Miscarriage    Rx / DC  Orders ED Discharge Orders          Ordered    misoprostol (CYTOTEC) 200 MCG tablet  3 times daily        01/14/22 1631    naproxen (NAPROSYN) 375 MG tablet  2 times daily with meals        01/14/22 1631              Arthor Captain, PA-C 01/14/22 1632    Franne Forts, DO 01/14/22 1802
